# Patient Record
Sex: Male | Born: 1975 | Hispanic: Yes | Marital: Married | Smoking: Never smoker
Health system: Southern US, Community
[De-identification: ages and names within clinical notes are randomized; demographics above are authoritative.]

## PROBLEM LIST (undated history)

## (undated) DIAGNOSIS — E119 Type 2 diabetes mellitus without complications: Secondary | ICD-10-CM

---

## 2016-12-14 ENCOUNTER — Inpatient Hospital Stay (HOSPITAL_COMMUNITY)
Admission: EM | Admit: 2016-12-14 | Discharge: 2016-12-17 | DRG: 917 | Disposition: A | Discharge status: Home / Self Care | Payer: Self-pay | Attending: Internal Medicine | Admitting: Internal Medicine

## 2016-12-14 ENCOUNTER — Encounter (HOSPITAL_COMMUNITY): Payer: Self-pay | Admitting: Emergency Medicine

## 2016-12-14 DIAGNOSIS — Z87891 Personal history of nicotine dependence: Secondary | ICD-10-CM

## 2016-12-14 DIAGNOSIS — K2921 Alcoholic gastritis with bleeding: Secondary | ICD-10-CM | POA: Diagnosis present

## 2016-12-14 DIAGNOSIS — E1110 Type 2 diabetes mellitus with ketoacidosis without coma: Secondary | ICD-10-CM | POA: Diagnosis present

## 2016-12-14 DIAGNOSIS — K920 Hematemesis: Secondary | ICD-10-CM | POA: Diagnosis present

## 2016-12-14 DIAGNOSIS — T512X1A Toxic effect of 2-Propanol, accidental (unintentional), initial encounter: Principal | ICD-10-CM

## 2016-12-14 DIAGNOSIS — F10230 Alcohol dependence with withdrawal, uncomplicated: Secondary | ICD-10-CM | POA: Diagnosis present

## 2016-12-14 DIAGNOSIS — R739 Hyperglycemia, unspecified: Secondary | ICD-10-CM | POA: Diagnosis present

## 2016-12-14 DIAGNOSIS — F10939 Alcohol use, unspecified with withdrawal, unspecified: Secondary | ICD-10-CM | POA: Diagnosis present

## 2016-12-14 DIAGNOSIS — F10239 Alcohol dependence with withdrawal, unspecified: Secondary | ICD-10-CM

## 2016-12-14 DIAGNOSIS — E860 Dehydration: Secondary | ICD-10-CM | POA: Diagnosis present

## 2016-12-14 DIAGNOSIS — K760 Fatty (change of) liver, not elsewhere classified: Secondary | ICD-10-CM | POA: Diagnosis present

## 2016-12-14 DIAGNOSIS — E1310 Other specified diabetes mellitus with ketoacidosis without coma: Secondary | ICD-10-CM

## 2016-12-14 DIAGNOSIS — R945 Abnormal results of liver function studies: Secondary | ICD-10-CM | POA: Diagnosis present

## 2016-12-14 DIAGNOSIS — M6282 Rhabdomyolysis: Secondary | ICD-10-CM | POA: Diagnosis present

## 2016-12-14 DIAGNOSIS — R7989 Other specified abnormal findings of blood chemistry: Secondary | ICD-10-CM

## 2016-12-14 DIAGNOSIS — E871 Hypo-osmolality and hyponatremia: Secondary | ICD-10-CM | POA: Diagnosis present

## 2016-12-14 DIAGNOSIS — Y92009 Unspecified place in unspecified non-institutional (private) residence as the place of occurrence of the external cause: Secondary | ICD-10-CM

## 2016-12-14 HISTORY — DX: Type 2 diabetes mellitus without complications: E11.9

## 2016-12-14 LAB — COMPREHENSIVE METABOLIC PANEL
ALBUMIN: 4.7 g/dL (ref 3.5–5.0)
ALT: 80 U/L — AB (ref 17–63)
AST: 115 U/L — AB (ref 15–41)
Alkaline Phosphatase: 100 U/L (ref 38–126)
Anion gap: 20 — ABNORMAL HIGH (ref 5–15)
BILIRUBIN TOTAL: 1.8 mg/dL — AB (ref 0.3–1.2)
BUN: 18 mg/dL (ref 6–20)
CO2: 13 mmol/L — ABNORMAL LOW (ref 22–32)
Calcium: 8.9 mg/dL (ref 8.9–10.3)
Chloride: 86 mmol/L — ABNORMAL LOW (ref 101–111)
Creatinine, Ser: 1.15 mg/dL (ref 0.61–1.24)
GFR calc Af Amer: >60 mL/min (ref >60)
GFR calc non Af Amer: >60 mL/min (ref >60)
GLUCOSE: 277 mg/dL — AB (ref 65–99)
POTASSIUM: 4.3 mmol/L (ref 3.5–5.1)
Sodium: 119 mmol/L — CL (ref 135–145)
TOTAL PROTEIN: 9.1 g/dL — AB (ref 6.5–8.1)

## 2016-12-14 LAB — URINALYSIS, ROUTINE W REFLEX MICROSCOPIC
BACTERIA UA: NONE SEEN
Bilirubin Urine: NEGATIVE
Glucose, UA: >=500 mg/dL — AB
Ketones, ur: 80 mg/dL — AB
Leukocytes, UA: NEGATIVE
NITRITE: NEGATIVE
Protein, ur: 100 mg/dL — AB
SPECIFIC GRAVITY, URINE: 1.025 (ref 1.005–1.030)
pH: 5 (ref 5.0–8.0)

## 2016-12-14 LAB — ACETAMINOPHEN LEVEL
Acetaminophen (Tylenol), Serum: <10 ug/mL — ABNORMAL LOW (ref 10–30)
Acetaminophen (Tylenol), Serum: <10 ug/mL — ABNORMAL LOW (ref 10–30)

## 2016-12-14 LAB — CBC
HEMATOCRIT: 44.8 % (ref 39.0–52.0)
Hemoglobin: 16.4 g/dL (ref 13.0–17.0)
MCH: 34.4 pg — ABNORMAL HIGH (ref 26.0–34.0)
MCHC: 36.6 g/dL — AB (ref 30.0–36.0)
MCV: 93.9 fL (ref 78.0–100.0)
Platelets: 247 10*3/uL (ref 150–400)
RBC: 4.77 MIL/uL (ref 4.22–5.81)
RDW: 12.1 % (ref 11.5–15.5)
WBC: 11.8 10*3/uL — ABNORMAL HIGH (ref 4.0–10.5)

## 2016-12-14 LAB — I-STAT CG4 LACTIC ACID, ED: LACTIC ACID, VENOUS: 1.69 mmol/L (ref 0.5–1.9)

## 2016-12-14 LAB — PROTIME-INR
INR: 1.1
Prothrombin Time: 14.2 seconds (ref 11.4–15.2)

## 2016-12-14 LAB — POC OCCULT BLOOD, ED: FECAL OCCULT BLD: POSITIVE — AB

## 2016-12-14 LAB — ETHANOL

## 2016-12-14 LAB — SALICYLATE LEVEL

## 2016-12-14 LAB — LIPASE, BLOOD: Lipase: 31 U/L (ref 11–51)

## 2016-12-14 MED ORDER — FAMOTIDINE IN NACL 20-0.9 MG/50ML-% IV SOLN
20.0000 mg | Freq: Once | INTRAVENOUS | Status: AC
  Administered 2016-12-14: 20 mg via INTRAVENOUS
  Filled 2016-12-14: qty 50

## 2016-12-14 MED ORDER — LORAZEPAM 2 MG/ML IJ SOLN
1.0000 mg | Freq: Once | INTRAMUSCULAR | Status: AC
  Administered 2016-12-14: 1 mg via INTRAVENOUS
  Filled 2016-12-14: qty 1

## 2016-12-14 MED ORDER — SODIUM CHLORIDE 0.9 % IV SOLN
80.0000 mg | Freq: Once | INTRAVENOUS | Status: AC
  Administered 2016-12-14: 80 mg via INTRAVENOUS
  Filled 2016-12-14: qty 80

## 2016-12-14 MED ORDER — SODIUM CHLORIDE 0.9 % IV SOLN
INTRAVENOUS | Status: DC
  Administered 2016-12-14 – 2016-12-15 (×2): via INTRAVENOUS

## 2016-12-14 MED ORDER — PANTOPRAZOLE SODIUM 40 MG IV SOLR: 40.0000 mg | Freq: Two times a day (BID) | INTRAVENOUS | Status: DC

## 2016-12-14 MED ORDER — THIAMINE HCL 100 MG/ML IJ SOLN
100.0000 mg | Freq: Once | INTRAMUSCULAR | Status: AC
  Administered 2016-12-14: 100 mg via INTRAVENOUS
  Filled 2016-12-14: qty 2

## 2016-12-14 MED ORDER — ONDANSETRON 4 MG PO TBDP
ORAL_TABLET | ORAL | Status: AC
  Filled 2016-12-14: qty 1

## 2016-12-14 MED ORDER — ONDANSETRON 4 MG PO TBDP
4.0000 mg | ORAL_TABLET | Freq: Once | ORAL | Status: AC | PRN
  Administered 2016-12-14: 4 mg via ORAL

## 2016-12-14 MED ORDER — ONDANSETRON HCL 4 MG/2ML IJ SOLN
4.0000 mg | Freq: Once | INTRAMUSCULAR | Status: AC
  Administered 2016-12-14: 4 mg via INTRAVENOUS
  Filled 2016-12-14: qty 2

## 2016-12-14 MED ORDER — SODIUM CHLORIDE 0.9 % IV SOLN
8.0000 mg/h | INTRAVENOUS | Status: DC
  Administered 2016-12-14 – 2016-12-16 (×4): 8 mg/h via INTRAVENOUS
  Filled 2016-12-14 (×11): qty 80

## 2016-12-14 MED ORDER — PANTOPRAZOLE SODIUM 40 MG IV SOLR: 40.0000 mg | Freq: Once | INTRAVENOUS | Status: DC

## 2016-12-14 NOTE — ED Notes (Signed)
ED Provider at bedside. 

## 2016-12-14 NOTE — ED Notes (Signed)
Dr. Donnald GarrePfeiffer informed of Na 119

## 2016-12-14 NOTE — H&P (Signed)
Fox ChaseAntonio Bailon Morgan ZOX:[id number] DOB: [birth year] DOA: 12/14/2016     PCP: Patient, No Pcp Per   Outpatient Specialists: [facility]  Patient coming from:   home Lives  With family    Chief Complaint: Nausea vomiting abdominal pain   HPI: Richard Morgan is a 41 y.o. male with medical history significant of Alcohol abuse and DM    Presented with 4 day history of nausea vomiting abdominal pain and epigastric region, vomitus significant for coffee-ground emesis. Patient drinks an regular basis about 6 beers per day. Sunday family noted that he drank a whole bottle of isopropyl alcohol because he couldn't get to regular alcohol.  Patient states he did not notice make him sick. He was noted to be severely confused thereafter. Patient had not had alcohol since Monday for past 3 days. Patient is no primary care provider and unaware about any past medical history. He denies history of seizures or DTs in the past but does reports tremors.  He states 3 years ago he was told that he was diabetic but never followed up.   IN ER:  Temp (24hrs), Avg:98.5 F (36.9 C), Min:98.5 F (36.9 C), Max:98.5 F (36.9 C)      on arrival  ED Triage Vitals  Enc Vitals Group     BP 12/14/16 1816 (!) 135/93     Pulse Rate 12/14/16 1816 (!) 136     Resp 12/14/16 1816 16     Temp 12/14/16 1816 98.5 F (36.9 C)     Temp Source 12/14/16 1816 Oral     SpO2 12/14/16 1816 98 %     Weight 12/14/16 1817 160 lb (72.6 kg)     Height 12/14/16 1817 5\' 1"  (1.549 m)     Head Circumference --      Peak Flow --      Pain Score 12/14/16 1821 10     Pain Loc --      Pain Edu? --      Excl. in GC? --    RR 19 HR 115 BP 138/105 LA 1.69 Lipase 31 Na 119 K 4.3 Bicarb 13 protein 9.1 alb 4.7 AG 20 AST 115 ALT 80  WBC 11.8 Hg 16.4 PLT 247   Following Medications were ordered in ER: Medications  0.9 %  sodium chloride infusion ( Intravenous New Bag/Given 12/14/16 2006)  pantoprazole (PROTONIX) 80 mg in sodium  chloride 0.9 % 100 mL IVPB (not administered)  ondansetron (ZOFRAN-ODT) disintegrating tablet 4 mg (4 mg Oral Given 12/14/16 1824)  ondansetron (ZOFRAN) injection 4 mg (4 mg Intravenous Given 12/14/16 2009)  famotidine (PEPCID) IVPB 20 mg premix (0 mg Intravenous Stopped 12/14/16 2145)  LORazepam (ATIVAN) injection 1 mg (1 mg Intravenous Given 12/14/16 2112)     ER provider discussed case with: Poison control center who recommends supportive management recommends obtaining a Ethelene glycol and methanol levels in addition to salicylate, acetaminophen, baseline EKG.. And GI who will see in consult in AM Hospitalist was called for admission for alcohol withdrawal, isopropyl alcohol ingestion, coffee-ground emesis  Review of Systems:    Pertinent positives include: confusion, abdominal pain, nausea, vomiting hematemesis  Constitutional:  No weight loss, night sweats, Fevers, chills, fatigue, weight loss  HEENT:  No headaches, Difficulty swallowing,Tooth/dental problems,Sore throat,  No sneezing, itching, ear ache, nasal congestion, post nasal drip,  Cardio-vascular:  No chest pain, Orthopnea, PND, anasarca, dizziness, palpitations.no Bilateral lower extremity swelling  GI:  No heartburn, indigestion,  diarrhea, change in bowel  habits, loss of appetite, melena, blood in stool, Resp:  no shortness of breath at rest. No dyspnea on exertion, No excess mucus, no productive cough, No non-productive cough, No coughing up of blood.No change in color of mucus.No wheezing. Skin:  no rash or lesions. No jaundice GU:  no dysuria, change in color of urine, no urgency or frequency. No straining to urinate.  No flank pain.  Musculoskeletal:  No joint pain or no joint swelling. No decreased range of motion. No back pain.  Psych:  No change in mood or affect. No depression or anxiety. No memory loss.  Neuro: no localizing neurological complaints, no tingling, no weakness, no double vision, no gait  abnormality, no slurred speech,    As per HPI otherwise 10 point review of systems negative.   Past Medical History: History reviewed. No pertinent past medical history. History reviewed. No pertinent surgical history.   Social History:  Ambulatory  Independently     reports that he has quit smoking. He has never used smokeless tobacco. He reports that he drinks alcohol. He reports that he does not use drugs.  Allergies:  No Known Allergies     Family History:   Family History  Problem Relation Age of Onset  . Diabetes Neg Hx   . Hypertension Neg Hx   . Cancer Neg Hx     Medications: Prior to Admission medications   Medication Sig Start Date End Date Taking? Authorizing Provider  calcium carbonate (TUMS - DOSED IN MG ELEMENTAL CALCIUM) 500 MG chewable tablet Chew 1-2 tablets by mouth 3 (three) times daily as needed for indigestion or heartburn.   Yes [provider]    Physical Exam: Patient Vitals for the past 24 hrs:  BP Temp Temp src Pulse Resp SpO2 Height Weight  12/14/16 2200 (!) 138/105 - - (!) 115 19 100 % - -  12/14/16 2130 (!) 138/102 - - (!) 115 (!) 35 100 % - -  12/14/16 2100 (!) 144/102 - - (!) 117 19 99 % - -  12/14/16 2030 (!) 148/109 - - (!) 108 (!) 21 100 % - -  12/14/16 2015 (!) 157/111 - - (!) 118 20 100 % - -  12/14/16 2011 (!) 146/108 - - (!) 108 - - - -  12/14/16 2000 (!) 146/108 - - (!) 111 19 100 % - -  12/14/16 1951 (!) 166/111 - - (!) 115 15 100 % - -  12/14/16 1817 - - - - - - 5\' 1"  (1.549 m) 72.6 kg (160 lb)  12/14/16 1816 (!) 135/93 98.5 F (36.9 C) Oral (!) 136 16 98 % - -    1. General:  in No Acute distress 2. Psychological: Alert and  Oriented 3. Head/ENT:   Dry Mucous Membranes                          Head Non traumatic, neck supple                          Poor Dentition 4. SKIN:   decreased Skin turgor,  Skin clean Dry and intact no rash 5. Heart: Regular rate and rhythm no  Murmur, Rub or gallop 6. Lungs: Clear  to auscultation bilaterally, no wheezes or crackles   7. Abdomen: Soft, epigastric tenderness, Non distended 8. Lower extremities: no clubbing, cyanosis, or edema 9. Neurologically Grossly intact, moving all 4 extremities equally  10. MSK: Normal range of motion   body mass index is 30.23 kg/m.  Labs on Admission:   Labs on Admission: I have personally reviewed following labs and imaging studies  CBC:  Recent Labs Lab 12/14/16 1830  WBC 11.8*  HGB 16.4  HCT 44.8  MCV 93.9  PLT 247   Basic Metabolic Panel:  Recent Labs Lab 12/14/16 1830  NA 119*  K 4.3  CL 86*  CO2 13*  GLUCOSE 277*  BUN 18  CREATININE 1.15  CALCIUM 8.9   GFR: Estimated Creatinine Clearance: 74.4 mL/min (by C-G formula based on SCr of 1.15 mg/dL). Liver Function Tests:  Recent Labs Lab 12/14/16 1830  AST 115*  ALT 80*  ALKPHOS 100  BILITOT 1.8*  PROT 9.1*  ALBUMIN 4.7    Recent Labs Lab 12/14/16 1830  LIPASE 31   No results for input(s): AMMONIA in the last 168 hours. Coagulation Profile: No results for input(s): INR, PROTIME in the last 168 hours. Cardiac Enzymes: No results for input(s): CKTOTAL, CKMB, CKMBINDEX, TROPONINI in the last 168 hours. BNP (last 3 results) No results for input(s): PROBNP in the last 8760 hours. HbA1C: No results for input(s): HGBA1C in the last 72 hours. CBG: No results for input(s): GLUCAP in the last 168 hours. Lipid Profile: No results for input(s): CHOL, HDL, LDLCALC, TRIG, CHOLHDL, LDLDIRECT in the last 72 hours. Thyroid Function Tests: No results for input(s): TSH, T4TOTAL, FREET4, T3FREE, THYROIDAB in the last 72 hours. Anemia Panel: No results for input(s): VITAMINB12, FOLATE, FERRITIN, TIBC, IRON, RETICCTPCT in the last 72 hours. Urine analysis:    Component Value Date/Time   COLORURINE YELLOW 12/14/2016 2107   APPEARANCEUR CLEAR 12/14/2016 2107   LABSPEC 1.025 12/14/2016 2107   PHURINE 5.0 12/14/2016 2107   GLUCOSEU >=500 (A)  12/14/2016 2107   HGBUR SMALL (A) 12/14/2016 2107   BILIRUBINUR NEGATIVE 12/14/2016 2107   KETONESUR 80 (A) 12/14/2016 2107   PROTEINUR 100 (A) 12/14/2016 2107   NITRITE NEGATIVE 12/14/2016 2107   LEUKOCYTESUR NEGATIVE 12/14/2016 2107   Sepsis Labs: @LABRCNTIP (procalcitonin:4,lacticidven:4) )No results found for this or any previous visit (from the past 240 hour(s)).     UA    no evidence of UTI     No results found for: HGBA1C  Estimated Creatinine Clearance: 74.4 mL/min (by C-G formula based on SCr of 1.15 mg/dL).  BNP (last 3 results) No results for input(s): PROBNP in the last 8760 hours.   ECG REPORT  Independently reviewed Rate: 121  Rhythm: sinus tachycardia ST&T Change: No acute ischemic changes   QTC 415  Filed Weights   12/14/16 1817  Weight: 72.6 kg (160 lb)     Cultures: No results found for: SDES, SPECREQUEST, CULT, REPTSTATUS   Radiological Exams on Admission: No results found.  Chart has been reviewed    Assessment/Plan  41 y.o. male with medical history significant of Alcohol abuse  Admitted for  alcohol withdrawal, isopropyl alcohol ingestion, coffee-ground emesis In Metabolic acidosis with AG    Present on Admission: . Hyponatremia - - likely secondary to dehydration vs beer potomania, will give IVF, check Urine Na, Cr, Osmolarity. Monitor Na levels to avoid over aggressive correction. Check TSH.   If no improvement with IVF will initiate further work up for SIADH if appropriate. Serial BMET  . Isopropyl alcohol poisoning - Supportive management,  . Hyperglycemia - patient  States was told he is diabetic in the past. Check HgA1C, TSH,  DKA is on diferential .  Elevated LFTs - in the setting of alcohol abuse, will follow LFT, order hepatitis panel . Coffee ground emesis - Protonix drip, obtain type and screen follow serial CBC, Suspect Mallory-Weiss tear versus alcoholic gastritis appreciate GI consult . Alcohol withdrawal syndrome with  complication, with unspecified complication (HCC) - CIWA protocol . Dehydration - rehydrate Ketosis with Metabolic acidosis - Differential include Isopropyl induced Ketosis,  Alcoholic ketoacidosis, starvation ketoacidosis secondary to decreased by mouth intake and repeated nausea vomiting and DKA with history of diabetes untreated. Given possibility of DKA with anion gap - initiate glucose stabilizer and rehydrate aggressively.  Other plan as per orders.  DVT prophylaxis:  SCD   Code Status:  FULL CODE  as per patient    Family Communication:   Family not at  Bedside    Disposition Plan:    To home once workup is complete and patient is stable                           Social Work and diabetic coordinator  consulted                          Consults called: GI     Admission status:    inpatient       Level of care     SDU      I have spent a total of 56 min on this admission   Melaine Mcphee 12/15/2016, 12:00 AM    [facility]  Pager [phone]   after 2 AM please page floor coverage PA If 7AM-7PM, please contact the day team taking care of the patient  [email or web address]  Password [email or web address]

## 2016-12-14 NOTE — ED Triage Notes (Signed)
Pt is alcoholic and wants to quit. He has tried to stop on his own but has been vomiting,can't eat, having abd pain, insomnia. Pt states he drank an entire bottle of rubbing alcohol and family states they saw him do this on Monday. No other alcohol since Monday. Pt usually drinks 6 beers per day usually.

## 2016-12-14 NOTE — ED Provider Notes (Signed)
[facility]-EMERGENCY DEPT Provider Note   CSN: [id number] Arrival date & time: 12/14/16  1752     History   Chief Complaint Chief Complaint  Patient presents with  . Abdominal Pain  . Alcohol Problem    HPI   Blood pressure (!) 157/111, pulse (!) 118, temperature 98.5 F (36.9 C), temperature source Oral, resp. rate 20, height 5\' 1"  (1.549 m), weight 72.6 kg (160 lb), SpO2 100 %.  Richard Morgan is a 41 y.o. male complaining of Multiple episodes of coffee-ground emesis over the last 3 days. Patient states that he drinks alcohol regularly he drinks beer daily. He does get shaky when he doesn't have alcohol abuse never had any seizures or hallucinations. On Sunday (4 days ago) he did not have any beer, his wife had the car and he drank a bottle of rubbing alcohol mixed with orange juice. He denies a suicide attempt. He states he did not realize that rubbing alcohol was the dangerous to him. That day his family states that he was acting very bizarrely, opening multiple doors, eyes were too wide and glazed, he urinated on the floor. The next day he started vomiting multiple times. He states that he does not have any known liver disease.Patient with no prior medical evaluation no primary care.  Family member has text added at picture of the bottle is 70% isopropyl alcohol is a 1 L bottle and patient said only half of the bottle was available he drank approximately 8 ounces.   History reviewed. No pertinent past medical history.  Patient Active Problem List   Diagnosis Date Noted  . Hyponatremia 12/14/2016  . 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 Hyperglycemia 12/14/2016  . Elevated LFTs 12/14/2016  . Coffee ground emesis 12/14/2016    History reviewed. No pertinent surgical history.     Home Medications    Prior to Admission medications   Medication Sig Start Date End Date Taking? Authorizing Provider  calcium carbonate (TUMS - DOSED IN MG ELEMENTAL CALCIUM) 500 MG  chewable tablet Chew 1-2 tablets by mouth 3 (three) times daily as needed for indigestion or heartburn.   Yes [provider]    Family History History reviewed. No pertinent family history.  Social History Social History  Substance Use Topics  . Smoking status: Former Games developer  . Smokeless tobacco: Never Used  . Alcohol use Yes     Allergies   Patient has no known allergies.   Review of Systems Review of Systems   A complete review of systems was obtained and all systems are negative except as noted in the HPI and PMH.    Physical Exam Updated Vital Signs BP (!) 138/105   Pulse (!) 115   Temp 98.5 F (36.9 C) (Oral)   Resp 19   Ht 5\' 1"  (1.549 m)   Wt 72.6 kg (160 lb)   SpO2 100%   BMI 30.23 kg/m   Physical Exam  Constitutional: He is oriented to person, place, and time. He appears well-developed and well-nourished. No distress.  HENT:  Head: Normocephalic and atraumatic.  Mouth/Throat: Oropharynx is clear and moist.  Eyes: Pupils are equal, round, and reactive to light. Conjunctivae and EOM are normal.  Neck: Normal range of motion.  Cardiovascular: Regular rhythm and intact distal pulses.   Tachycardic, regular  Pulmonary/Chest: Effort normal and breath sounds normal.  Abdominal: Soft. There is tenderness.  Tender in the bilateral upper quadrants with no guarding or rebound.  Genitourinary:  Genitourinary Comments: Rectal  exam is chaperoned by technician: Normal rectal tone, normal stool color.  Musculoskeletal: Normal range of motion.  Neurological: He is alert and oriented to person, place, and time.  Skin: He is not diaphoretic.  Psychiatric: He has a normal mood and affect.  Nursing note and vitals reviewed.    ED Treatments / Results  Labs (all labs ordered are listed, but only abnormal results are displayed) Labs Reviewed  COMPREHENSIVE METABOLIC PANEL - Abnormal; Notable for the following:       Result Value   Sodium 119 (*)     Chloride 86 (*)    CO2 13 (*)    Glucose, Bld 277 (*)    Total Protein 9.1 (*)    AST 115 (*)    ALT 80 (*)    Total Bilirubin 1.8 (*)    Anion gap 20 (*)    All other components within normal limits  CBC - Abnormal; Notable for the following:    WBC 11.8 (*)    MCH 34.4 (*)    MCHC 36.6 (*)    All other components within normal limits  URINALYSIS, ROUTINE W REFLEX MICROSCOPIC - Abnormal; Notable for the following:    Glucose, UA >=500 (*)    Hgb urine dipstick SMALL (*)    Ketones, ur 80 (*)    Protein, ur 100 (*)    Squamous Epithelial / LPF 0-5 (*)    All other components within normal limits  ACETAMINOPHEN LEVEL - Abnormal; Notable for the following:    Acetaminophen (Tylenol), Serum <10 (*)    All other components within normal limits  ACETAMINOPHEN LEVEL - Abnormal; Notable for the following:    Acetaminophen (Tylenol), Serum <10 (*)    All other components within normal limits  LIPASE, BLOOD  ETHANOL  SALICYLATE LEVEL  SALICYLATE LEVEL  VOLATILES,BLD-ACETONE,ETHANOL,ISOPROP,METHANOL  OSMOLALITY  PROTIME-INR  BASIC METABOLIC PANEL  HIV ANTIBODY (ROUTINE TESTING)  TSH  CK  TROPONIN I  TROPONIN I  TROPONIN I  CREATININE, URINE, RANDOM  OSMOLALITY, URINE  SODIUM, URINE, RANDOM  RAPID URINE DRUG SCREEN, HOSP PERFORMED  I-STAT CG4 LACTIC ACID, ED  POC OCCULT BLOOD, ED    EKG  EKG Interpretation None       Radiology No results found.  Procedures Procedures (including critical care time)  CRITICAL CARE Performed by: Wynetta EmeryPISCIOTTA, Lita Flynn   Total critical care time: 35 minutes  Critical care time was exclusive of separately billable procedures and treating other patients.  Critical care was necessary to treat or prevent imminent or life-threatening deterioration.  Critical care was time spent personally by me on the following activities: development of treatment plan with patient and/or surrogate as well as nursing, discussions with consultants,  evaluation of patient's response to treatment, examination of patient, obtaining history from patient or surrogate, ordering and performing treatments and interventions, ordering and review of laboratory studies, ordering and review of radiographic studies, pulse oximetry and re-evaluation of patient's condition.   Medications Ordered in ED Medications  0.9 %  sodium chloride infusion ( Intravenous New Bag/Given 12/14/16 2006)  pantoprazole (PROTONIX) 80 mg in sodium chloride 0.9 % 100 mL IVPB (not administered)  pantoprazole (PROTONIX) 80 mg in sodium chloride 0.9 % 250 mL (0.32 mg/mL) infusion (not administered)  pantoprazole (PROTONIX) injection 40 mg (not administered)  ondansetron (ZOFRAN-ODT) disintegrating tablet 4 mg (4 mg Oral Given 12/14/16 1824)  ondansetron (ZOFRAN) injection 4 mg (4 mg Intravenous Given 12/14/16 2009)  famotidine (PEPCID) IVPB 20 mg premix (0  mg Intravenous Stopped 12/14/16 2145)  LORazepam (ATIVAN) injection 1 mg (1 mg Intravenous Given 12/14/16 2112)     Initial Impression / Assessment and Plan / ED Course  I have reviewed the triage vital signs and the nursing notes.  Pertinent labs & imaging results that were available during my care of the patient were reviewed by me and considered in my medical decision making (see chart for details).     Vitals:   12/14/16 2030 12/14/16 2100 12/14/16 2130 12/14/16 2200  BP: (!) 148/109 (!) 144/102 (!) 138/102 (!) 138/105  Pulse: (!) 108 (!) 117 (!) 115 (!) 115  Resp: (!) 21 19 (!) 35 19  Temp:      TempSrc:      SpO2: 100% 99% 100% 100%  Weight:      Height:        Medications  0.9 %  sodium chloride infusion ( Intravenous New Bag/Given 12/14/16 2006)  pantoprazole (PROTONIX) 80 mg in sodium chloride 0.9 % 100 mL IVPB (not administered)  pantoprazole (PROTONIX) 80 mg in sodium chloride 0.9 % 250 mL (0.32 mg/mL) infusion (not administered)  pantoprazole (PROTONIX) injection 40 mg (not administered)  ondansetron  (ZOFRAN-ODT) disintegrating tablet 4 mg (4 mg Oral Given 12/14/16 1824)  ondansetron (ZOFRAN) injection 4 mg (4 mg Intravenous Given 12/14/16 2009)  famotidine (PEPCID) IVPB 20 mg premix (0 mg Intravenous Stopped 12/14/16 2145)  LORazepam (ATIVAN) injection 1 mg (1 mg Intravenous Given 12/14/16 2112)    Owens & Minorntonio Bailon Morgan is 41 y.o. male presenting withAccidental toxicity from isopropyl alcohol poisoning. He states that he drank a bottle 4 days ago because he wanted to drink alcohol but he couldn't get to the store to buy beer. Initially altered and acting bizarrely, he is oriented 3 with a normal neurologic exam, non-somnolent. He has had multiple episodes of coffee-ground emesis over the last 3 days. Patient is tachycardic on my exam, abdominal exam is nonsurgical.  [facility]: They have consulted the toxicologist, she recommends obtaining a sling glycol and methanol levels in addition to salicylate, acetaminophen, baseline EKG. Will need admission for correction of hyponatremia. EKG with a normal QTC, sinus tachycardia.  Poison control has recommended sending the toxic alcohol levels to [facility] [place] or [facility] [place] because they But I results however, I have discussed this with Byrd HesselbachMaria in lab he states that they can be sent to lab core and they can be sent as a stat and we will have the results turned around within 24 hours.   Patient is reporting coffee-ground emesis, started on protonix and pepcid.  Hyponatremia of 119, normal saline given at 250 mL per hour.  Unassigned admission to [facility].  Discussed with GI, they will evaluate in a.m.   Final Clinical Impressions(s) / ED Diagnoses   Final diagnoses:  Hyponatremia  Accidental poisoning by isopropyl alcohol, initial encounter    New Prescriptions New Prescriptions   No medications on file     Kaylyn Limisciotta, Kailin Principato, PA-C 12/14/16 2301    Arby BarrettePfeiffer, Marcy, MD 12/15/16  0003

## 2016-12-15 ENCOUNTER — Inpatient Hospital Stay (HOSPITAL_COMMUNITY): Payer: Self-pay

## 2016-12-15 LAB — BASIC METABOLIC PANEL
Anion gap: 18 — ABNORMAL HIGH (ref 5–15)
BUN: 17 mg/dL (ref 6–20)
CHLORIDE: 93 mmol/L — AB (ref 101–111)
CO2: 13 mmol/L — ABNORMAL LOW (ref 22–32)
Calcium: 8.6 mg/dL — ABNORMAL LOW (ref 8.9–10.3)
Creatinine, Ser: 1.13 mg/dL (ref 0.61–1.24)
GFR calc non Af Amer: >60 mL/min (ref >60)
Glucose, Bld: 230 mg/dL — ABNORMAL HIGH (ref 65–99)
POTASSIUM: 4.2 mmol/L (ref 3.5–5.1)
SODIUM: 124 mmol/L — AB (ref 135–145)

## 2016-12-15 LAB — GLUCOSE, CAPILLARY
GLUCOSE-CAPILLARY: 160 mg/dL — AB (ref 65–99)
GLUCOSE-CAPILLARY: 185 mg/dL — AB (ref 65–99)
GLUCOSE-CAPILLARY: 166 mg/dL — AB (ref 65–99)
GLUCOSE-CAPILLARY: 212 mg/dL — AB (ref 65–99)
GLUCOSE-CAPILLARY: 154 mg/dL — AB (ref 65–99)
GLUCOSE-CAPILLARY: 190 mg/dL — AB (ref 65–99)
GLUCOSE-CAPILLARY: 170 mg/dL — AB (ref 65–99)
GLUCOSE-CAPILLARY: 117 mg/dL — AB (ref 65–99)
GLUCOSE-CAPILLARY: 179 mg/dL — AB (ref 65–99)
Glucose-Capillary: 158 mg/dL — ABNORMAL HIGH (ref 65–99)
Glucose-Capillary: 163 mg/dL — ABNORMAL HIGH (ref 65–99)
Glucose-Capillary: 139 mg/dL — ABNORMAL HIGH (ref 65–99)
Glucose-Capillary: 114 mg/dL — ABNORMAL HIGH (ref 65–99)
Glucose-Capillary: 126 mg/dL — ABNORMAL HIGH (ref 65–99)
Glucose-Capillary: 151 mg/dL — ABNORMAL HIGH (ref 65–99)
Glucose-Capillary: 152 mg/dL — ABNORMAL HIGH (ref 65–99)
Glucose-Capillary: 159 mg/dL — ABNORMAL HIGH (ref 65–99)

## 2016-12-15 LAB — BLOOD GAS, ARTERIAL
Acid-base deficit: 8.6 mmol/L — ABNORMAL HIGH (ref 0.0–2.0)
BICARBONATE: 15.7 mmol/L — AB (ref 20.0–28.0)
Drawn by: 27022
FIO2: 21
O2 Saturation: 98.7 %
PCO2 ART: 27.7 mmHg — AB (ref 32.0–48.0)
PH ART: 7.37 (ref 7.350–7.450)
PO2 ART: 117 mmHg — AB (ref 83.0–108.0)
Patient temperature: 97.8

## 2016-12-15 LAB — RAPID URINE DRUG SCREEN, HOSP PERFORMED
Amphetamines: NOT DETECTED
BARBITURATES: NOT DETECTED
BENZODIAZEPINES: NOT DETECTED
COCAINE: NOT DETECTED
OPIATES: NOT DETECTED
TETRAHYDROCANNABINOL: NOT DETECTED

## 2016-12-15 LAB — CBC
HCT: 40.8 % (ref 39.0–52.0)
Hemoglobin: 14.5 g/dL (ref 13.0–17.0)
MCH: 33.4 pg (ref 26.0–34.0)
MCHC: 35.5 g/dL (ref 30.0–36.0)
MCV: 94 fL (ref 78.0–100.0)
PLATELETS: 197 10*3/uL (ref 150–400)
RBC: 4.34 MIL/uL (ref 4.22–5.81)
RDW: 12.2 % (ref 11.5–15.5)
WBC: 11.6 10*3/uL — ABNORMAL HIGH (ref 4.0–10.5)

## 2016-12-15 LAB — TROPONIN I
TROPONIN I: 0.03 ng/mL — AB (ref <0.03)
Troponin I: <0.03 ng/mL (ref <0.03)

## 2016-12-15 LAB — COMPREHENSIVE METABOLIC PANEL
ALK PHOS: 76 U/L (ref 38–126)
ALT: 59 U/L (ref 17–63)
AST: 77 U/L — ABNORMAL HIGH (ref 15–41)
Albumin: 3.8 g/dL (ref 3.5–5.0)
Anion gap: 15 (ref 5–15)
BUN: 15 mg/dL (ref 6–20)
CALCIUM: 8.1 mg/dL — AB (ref 8.9–10.3)
CHLORIDE: 99 mmol/L — AB (ref 101–111)
CO2: 13 mmol/L — ABNORMAL LOW (ref 22–32)
CREATININE: 1.16 mg/dL (ref 0.61–1.24)
Glucose, Bld: 191 mg/dL — ABNORMAL HIGH (ref 65–99)
Potassium: 3.8 mmol/L (ref 3.5–5.1)
Sodium: 127 mmol/L — ABNORMAL LOW (ref 135–145)
Total Bilirubin: 1.5 mg/dL — ABNORMAL HIGH (ref 0.3–1.2)
Total Protein: 7.4 g/dL (ref 6.5–8.1)

## 2016-12-15 LAB — VOLATILES,BLD-ACETONE,ETHANOL,ISOPROP,METHANOL
Acetone, blood: 0.056 % — ABNORMAL HIGH (ref 0.000–0.010)
Ethanol, blood: NEGATIVE % (ref 0.000–0.010)
ISOPROPANOL, BLOOD: NEGATIVE % (ref 0.000–0.010)
METHANOL, BLOOD: NEGATIVE % (ref 0.000–0.010)

## 2016-12-15 LAB — TYPE AND SCREEN
ABO/RH(D): O POS
ANTIBODY SCREEN: NEGATIVE

## 2016-12-15 LAB — MRSA PCR SCREENING: MRSA BY PCR: NEGATIVE

## 2016-12-15 LAB — ABO/RH: ABO/RH(D): O POS

## 2016-12-15 LAB — MAGNESIUM: MAGNESIUM: 2.2 mg/dL (ref 1.7–2.4)

## 2016-12-15 LAB — BETA-HYDROXYBUTYRIC ACID: Beta-Hydroxybutyric Acid: 7.26 mmol/L — ABNORMAL HIGH (ref 0.05–0.27)

## 2016-12-15 LAB — TSH
TSH: 1.089 u[IU]/mL (ref 0.350–4.500)
TSH: 1.01 u[IU]/mL (ref 0.350–4.500)

## 2016-12-15 LAB — HIV ANTIBODY (ROUTINE TESTING W REFLEX): HIV SCREEN 4TH GENERATION: NONREACTIVE

## 2016-12-15 LAB — CK: Total CK: 822 U/L — ABNORMAL HIGH (ref 49–397)

## 2016-12-15 LAB — CBG MONITORING, ED
Glucose-Capillary: 224 mg/dL — ABNORMAL HIGH (ref 65–99)
Glucose-Capillary: 206 mg/dL — ABNORMAL HIGH (ref 65–99)

## 2016-12-15 LAB — OSMOLALITY: Osmolality: 294 mOsm/kg (ref 275–295)

## 2016-12-15 LAB — PHOSPHORUS: Phosphorus: <1 mg/dL — CL (ref 2.5–4.6)

## 2016-12-15 LAB — SODIUM, URINE, RANDOM: Sodium, Ur: 53 mmol/L

## 2016-12-15 MED ORDER — SODIUM CHLORIDE 0.9 % IV SOLN
INTRAVENOUS | Status: DC
  Administered 2016-12-15: 1.6 [IU]/h via INTRAVENOUS
  Filled 2016-12-15 (×2): qty 1

## 2016-12-15 MED ORDER — THIAMINE HCL 100 MG/ML IJ SOLN
100.0000 mg | Freq: Every day | INTRAMUSCULAR | Status: DC
  Administered 2016-12-15 – 2016-12-17 (×3): 100 mg via INTRAVENOUS
  Filled 2016-12-15 (×3): qty 2

## 2016-12-15 MED ORDER — K PHOS MONO-SOD PHOS DI & MONO 155-852-130 MG PO TABS
500.0000 mg | ORAL_TABLET | Freq: Two times a day (BID) | ORAL | Status: DC
  Administered 2016-12-15 (×2): 500 mg via ORAL
  Filled 2016-12-15 (×3): qty 2

## 2016-12-15 MED ORDER — POTASSIUM PHOSPHATES 15 MMOLE/5ML IV SOLN
20.0000 mmol | Freq: Once | INTRAVENOUS | Status: DC
  Administered 2016-12-15: 20 mmol via INTRAVENOUS
  Filled 2016-12-15: qty 6.67

## 2016-12-15 MED ORDER — SODIUM CHLORIDE 0.9 % IV SOLN
INTRAVENOUS | Status: DC
  Administered 2016-12-15: 02:00:00 via INTRAVENOUS

## 2016-12-15 MED ORDER — ONDANSETRON HCL 4 MG/2ML IJ SOLN: 4.0000 mg | Freq: Four times a day (QID) | INTRAMUSCULAR | Status: DC | PRN

## 2016-12-15 MED ORDER — DEXTROSE-NACL 5-0.45 % IV SOLN
INTRAVENOUS | Status: DC
  Administered 2016-12-15 – 2016-12-16 (×5): via INTRAVENOUS

## 2016-12-15 MED ORDER — PNEUMOCOCCAL VAC POLYVALENT 25 MCG/0.5ML IJ INJ
0.5000 mL | INJECTION | INTRAMUSCULAR | Status: AC | PRN
  Administered 2016-12-17: 0.5 mL via INTRAMUSCULAR

## 2016-12-15 MED ORDER — FOLIC ACID 5 MG/ML IJ SOLN
1.0000 mg | Freq: Every day | INTRAMUSCULAR | Status: DC
  Administered 2016-12-15 – 2016-12-17 (×3): 1 mg via INTRAVENOUS
  Filled 2016-12-15 (×3): qty 0.2

## 2016-12-15 MED ORDER — INSULIN REGULAR BOLUS VIA INFUSION
0.0000 [IU] | Freq: Three times a day (TID) | INTRAVENOUS | Status: DC
  Filled 2016-12-15: qty 10

## 2016-12-15 MED ORDER — LORAZEPAM 2 MG/ML IJ SOLN: 2.0000 mg | INTRAMUSCULAR | Status: DC | PRN

## 2016-12-15 MED ORDER — LIVING WELL WITH DIABETES BOOK
Freq: Once | Status: AC
  Administered 2016-12-15: 1
  Filled 2016-12-15: qty 1

## 2016-12-15 MED ORDER — SODIUM CHLORIDE 0.9 % IV SOLN
INTRAVENOUS | Status: DC
  Administered 2016-12-15 – 2016-12-16 (×2): via INTRAVENOUS

## 2016-12-15 MED ORDER — ONDANSETRON HCL 4 MG PO TABS: 4.0000 mg | ORAL_TABLET | Freq: Four times a day (QID) | ORAL | Status: DC | PRN

## 2016-12-15 MED ORDER — DEXTROSE 50 % IV SOLN: 25.0000 mL | INTRAVENOUS | Status: DC | PRN

## 2016-12-15 MED ORDER — LIVING WELL WITH DIABETES BOOK - IN SPANISH
Freq: Once | Status: AC
  Administered 2016-12-15: 13:00:00
  Filled 2016-12-15: qty 1

## 2016-12-15 NOTE — Progress Notes (Addendum)
CRITICAL VALUE ALERT  Critical Value:  Phosphorus <1.0  Date & Time Notied:  12/15/16 0608  Provider Notified: On call MD Selena BattenKim  Orders Received/Actions taken: Phosphorus replacement ordered. Will continue to monitor.

## 2016-12-15 NOTE — ED Notes (Signed)
Attempted to call report

## 2016-12-15 NOTE — Progress Notes (Signed)
Initial Nutrition Assessment  DOCUMENTATION CODES:   Not applicable  INTERVENTION:   -RD will follow for diet advancement and supplement diet as appropriate -Will follow-up with DM diet education once pt is more stable  NUTRITION DIAGNOSIS:   Inadequate oral intake related to altered GI function as evidenced by NPO status.  GOAL:   Patient will meet greater than or equal to 90% of their needs  MONITOR:   PO intake, Diet advancement, Labs, Weight trends, Skin, I & O's  REASON FOR ASSESSMENT:   Malnutrition Screening Tool, Consult Assessment of nutrition requirement/status (Diet education)  ASSESSMENT:   41 y.o. male with medical history significant of Alcohol abuse  Admitted for  alcohol withdrawal, isopropyl alcohol ingestion, coffee-ground emesis In Metabolic acidosis with AG  Spoke with pt, who reports poor appetite over the past 4-5 days, due to inability to keep down foods and liquids. Pt shares that he generally has a good appetite, consuming 3 meals per day (meals consist of chicken soup or meat, vegetables, and rice).   Pt endorses a 100# wt loss over the past year, however, no wt hx available to confirm this. Pt suspects his weight loss is related to his alcohol intake- he shares that he was consuming a 12 pack of beer daily up until a week ago.   Nutrition-Focused physical exam completed. Findings are no fat depletion, mild (calf and patella only, which pt shares is typical presentation) muscle depletion, and no edema. 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   Broached topic of blood sugar control with pt. He reports that he was told he had high blood sugars approximately 3 years ago and was prescribed medication (pt unsure which) that MD instructed him "to take for the rest of my life". However, pt only consumed one month dose and never refilled or followed-up with MD. He does not have a PCP. When asked about current blood sugar issues, pt  reported he was unaware of any issue currently. Due to pt's perceived lack of awareness of diagnosis, RD held off on further education. Pt is currently on a glucostablizer. No recent Hgb A1c to assess.   Medications reviewed and include thiamine.  Labs reviewed: Na: 127, Phos: <1 (on IV supplementation), CBGS: 163-206.   Diet Order:  Diet NPO time specified Except for: Sips with Meds  Skin:  Reviewed, no issues  Last BM:  12/15/16  Height:   Ht Readings from Last 1 Encounters:  12/15/16 5\' 5"  (1.651 m)    Weight:   Wt Readings from Last 1 Encounters:  12/15/16 154 lb 12.2 oz (70.2 kg)    Ideal Body Weight:  61.8 kg  BMI:  Body mass index is 25.75 kg/m.  Estimated Nutritional Needs:   Kcal:  1700-1900  Protein:  105-120 grams  Fluid:  1.7- 1.9 L  EDUCATION NEEDS:   Education needs no appropriate at this time  Cydnie Deason A. Mayford KnifeWilliams, RD, LDN, CDE Pager: [phone] After hours Pager: [phone]

## 2016-12-15 NOTE — ED Notes (Signed)
Informed Doutova, MD of resulted lab work.

## 2016-12-15 NOTE — Progress Notes (Signed)
Updated Denise with Poison Control regarding pt condition.

## 2016-12-15 NOTE — ED Notes (Signed)
Patient transported to Ultrasound 

## 2016-12-15 NOTE — ED Notes (Signed)
Updated Onalee Huaavid with [facility] regarding the pt's condition. They will check back again later in the morning for additional follow up.

## 2016-12-15 NOTE — Progress Notes (Signed)
[facility] PROGRESS NOTE  Richard Morgan ZOX:[id number] DOB: [birth year] DOA: 12/14/2016   PCP: Patient, No Pcp Per     Assessment/Plan: Active Problems:   Hyponatremia   Isopropyl alcohol poisoning   Hyperglycemia   Elevated LFTs   Coffee ground emesis   Alcohol withdrawal syndrome with complication, with unspecified complication (HCC)   Dehydration   DKA (diabetic ketoacidoses) (HCC)   41 y.o. male history of alcohol abuse and diabetes  complaining of Multiple episodes of coffee-ground emesis over the last 3 days. Patient states that he drinks alcohol regularly he drinks beer daily. He does get shaky when he doesn't have alcohol abuse never had any seizures or hallucinations. On Sunday (4 days ago) he did not have any beer, his wife had the car and he drank a bottle of rubbing alcohol mixed with orange juice. He denies a suicide attempt. Subsequently found to have severe hyponatremia, metabolic acidosis, hyperphosphatemia. Patient also found to have elevated CBG, increased anion gap, admitted for concomitant DKA  Assessment and plan  Hyponatremia - improving 119>127- likely secondary to dehydration vs beer potomania, improved after IV hydration with normal saline. Monitor Na levels to avoid over aggressive correction. Normal TSH.   Serial BMET. Reduce rate of normal saline to 125 mL an hour, to slow down the correction  . Isopropyl alcohol poisoning - Supportive management, isopropyl alcohol poisoning does not cause anion gap metabolic acidosis, causes ketosis, ketonuria noted, QTC prolongation, will repeat EKG, poison control notified and following  Hypophosphatemia-replete aggressively  . Concomitant DKA - initial anion gap was 20, glucose 277, patient   started on glucose stabilizer drip. Hemoglobin A1c pending   . Elevated LFTs - in the setting of alcohol abuse, will follow LFT, order hepatitis panel. Could be secondary to mild elevation in CK 822  . Coffee  ground emesis - Protonix iv , hemoglobin currently stable, Suspect Mallory-Weiss tear versus alcoholic gastritis     . Alcohol withdrawal syndrome with complication, with unspecified complication (HCC) - CIWA protocol  . Dehydration - rehydrate      DVT prophylaxsis SCD  Code Status:  Full code   Family Communication: Discussed in detail with the patient, all imaging results, lab results explained to the patient   Disposition Plan: Continue stepdown, may dc in am if stable       Consultants:  None  Procedures:  None  Antibiotics: Anti-infectives    None         HPI/Subjective: Awake and alert,ambulating , daughter by the bedside   Objective: Vitals:   12/15/16 0330 12/15/16 0400 12/15/16 0429 12/15/16 0733  BP: 129/85 123/78 (!) 133/91   Pulse: (!) 112 (!) 113 (!) 115   Resp: 20 18 20   Temp:   98.3 F (36.8 C) 97.8 F (36.6 C)  TempSrc:   Oral Oral  SpO2: 96% 100% 100%   Weight:   70.2 kg (154 lb 12.2 oz)   Height:   5\' 5" (1.651 m)     Intake/Output Summary (Last 24 hours) at 12/15/16 0822 Last data filed at 12/15/16 0600  Gross per 24 hour  Intake          3330.92 ml  Output              650 ml  Net          26 80.92 ml    Exam:  Examination:  General exam: Appears calm and comfortable  Respiratory system:  Clear to auscultation. Respiratory effort normal. Cardiovascular system: S1 & S2 heard, RRR. No JVD, murmurs, rubs, gallops or clicks. No pedal edema. Gastrointestinal system: Abdomen is nondistended, soft and nontender. No organomegaly or masses felt. Normal bowel sounds heard. Central nervous system: Alert and oriented. No focal neurological deficits. Extremities: Symmetric 5 x 5 power. Skin: No rashes, lesions or ulcers Psychiatry: Judgement and insight appear normal. Mood & affect appropriate.     Data Reviewed: I have personally reviewed following labs and imaging studies  Micro Results Recent Results (from the past 240  hour(s))  MRSA PCR Screening     Status: None   Collection Time: 12/15/16  4:25 AM  Result Value Ref Range Status   MRSA by PCR NEGATIVE NEGATIVE Final    Comment:        The GeneXpert MRSA Assay (FDA approved for NASAL specimens only), is one component of a comprehensive MRSA colonization surveillance program. It is not intended to diagnose MRSA infection nor to guide or monitor treatment for MRSA infections.     Radiology Reports [place] Abdomen Limited Ruq  Result Date: 12/15/2016 CLINICAL DATA:  Elevated liver function studies today. Nausea, vomiting, and abdominal pain for 4 days. History of diabetes and alcohol abuse. EXAM: ULTRASOUND ABDOMEN LIMITED RIGHT UPPER QUADRANT COMPARISON:  None. FINDINGS: Gallbladder: No gallstones or wall thickening visualized. No sonographic Murphy sign noted by sonographer. Common bile duct: Diameter: 3.2 mm, normal Liver: Diffusely increased hepatic parenchymal echotexture suggesting fatty infiltration. IMPRESSION: No evidence of cholelithiasis or cholecystitis. Mild diffuse fatty infiltration of the liver. Electronically Signed   By: Burman NievesWilliam  Stevens M.D.   On: 12/15/2016 02:21     CBC  Recent Labs Lab 12/14/16 1830 12/15/16 0429  WBC 11.8* 11.6*  HGB 16.4 14.5  HCT 44.8 40.8  PLT 247 197  MCV 93.9 94.0  MCH 34.4* 33.4  MCHC 36.6* 35.5  RDW 12.1 12.2    Chemistries   Recent Labs Lab 12/14/16 1830 12/14/16 2310 12/15/16 0429  NA 119* 124* 127*  K 4.3 4.2 3.8  CL 86* 93* 99*  CO2 13* 13* 13*  GLUCOSE 277* 230* 191*  BUN 18 17 15   CREATININE 1.15 1.13 1.16  CALCIUM 8.9 8.6* 8.1*  MG  --   --  2.2  AST 115*  --  77*  ALT 80*  --  59  ALKPHOS 100  --  76  BILITOT 1.8*  --  1.5*   ------------------------------------------------------------------------------------------------------------------ estimated creatinine clearance is 75.1 mL/min (by C-G formula based on SCr of 1.16  mg/dL). ------------------------------------------------------------------------------------------------------------------ No results for input(s): HGBA1C in the last 72 hours. ------------------------------------------------------------------------------------------------------------------ No results for input(s): CHOL, HDL, LDLCALC, TRIG, CHOLHDL, LDLDIRECT in the last 72 hours. ------------------------------------------------------------------------------------------------------------------  Recent Labs  12/15/16 0252  TSH 1.010   ------------------------------------------------------------------------------------------------------------------ No results for input(s): VITAMINB12, FOLATE, FERRITIN, TIBC, IRON, RETICCTPCT in the last 72 hours.  Coagulation profile  Recent Labs Lab 12/14/16 2310  INR 1.10    No results for input(s): DDIMER in the last 72 hours.  Cardiac Enzymes  Recent Labs Lab 12/14/16 2310 12/15/16 0429  TROPONINI 0.03* <0.03   ------------------------------------------------------------------------------------------------------------------ Invalid input(s): POCBNP   CBG:  Recent Labs Lab 12/15/16 0242 12/15/16 0403 12/15/16 0505 12/15/16 0615  GLUCAP 224* 206* 160* 158*       Studies: [place] Abdomen Limited Ruq  Result Date: 12/15/2016 CLINICAL DATA:  Elevated liver function studies today. Nausea, vomiting, and abdominal pain for 4 days. History of diabetes and alcohol abuse. EXAM: ULTRASOUND  ABDOMEN LIMITED RIGHT UPPER QUADRANT COMPARISON:  None. FINDINGS: Gallbladder: No gallstones or wall thickening visualized. No sonographic Murphy sign noted by sonographer. Common bile duct: Diameter: 3.2 mm, normal Liver: Diffusely increased hepatic parenchymal echotexture suggesting fatty infiltration. IMPRESSION: No evidence of cholelithiasis or cholecystitis. Mild diffuse fatty infiltration of the liver. Electronically Signed   By: Burman Nieves M.D.    On: 12/15/2016 02:21      No results found for: HGBA1C Lab Results  Component Value Date   CREATININE 1.16 12/15/2016       Scheduled Meds: . folic acid  1 mg Intravenous Daily  . insulin regular  0-10 Units Intravenous TID WC  . [START ON 12/18/2016] pantoprazole  40 mg Intravenous Q12H  . phosphorus  500 mg Oral BID  . thiamine  100 mg Intravenous Daily   Continuous Infusions: . sodium chloride    . sodium chloride 150 mL/hr at 12/15/16 0150  . dextrose 5 % and 0.45% NaCl 125 mL/hr at 12/15/16 0259  . insulin (NOVOLIN-R) infusion 2 Units/hr (12/15/16 0511)  . pantoprozole (PROTONIX) infusion 8 mg/hr (12/14/16 2345)  . potassium phosphate IVPB (mmol) 20 mmol (12/15/16 0658)     LOS: 1 day    Time spent: >30 MINS    Richarda Overlie  [facility] Pager [phone]. If 7PM-7AM, please contact night-coverage at [email or web address], password [facility] 12/15/2016, 8:22 AM  LOS: 1 day

## 2016-12-15 NOTE — Care Management Note (Addendum)
Case Management Note  Patient Details  Name: Namon Cirrintonio Bailon Roman MRN: [id number] Date of Birth: [birth year]  Subjective/Objective:    Pt admitted with abd pain, ETOH and ingestion of rubbing alcohol                Action/Plan:   PTA from home - reportedly drinks 6 beers a day and on 7/29  pt ingested rubbing alcohol - CSW consulted for ongoing substance abuse.  Pt is also an untreated diabetic - diabetic coordinator consulted. Pt will need PCP appt prior to discharge and likely MATCH.  CM will continue to follow for discharge needs   Expected Discharge Date:                  Expected Discharge Plan:  Home/Self Care  In-House Referral:  Clinical Social Work  Discharge planning Services  CM Consult  Post Acute Care Choice:    Choice offered to:     DME Arranged:    DME Agency:     HH Arranged:    HH Agency:     Status of Service:     If discussed at [employer] Meetings, dates discussed:    Additional Comments:  Cherylann ParrClaxton, Aslyn Cottman S, RN 12/15/2016, 8:48 AM

## 2016-12-15 NOTE — Progress Notes (Signed)
Inpatient Diabetes Program Recommendations  AACE/ADA: New Consensus Statement on Inpatient Glycemic Control (2015)  Target Ranges:  Prepandial:   less than 140 mg/dL      Peak postprandial:   less than 180 mg/dL (1-2 hours)      Critically ill patients:  140 - 180 mg/dL   Lab Results  Component Value Date   GLUCAP 185 (H) 12/15/2016    Review of Glycemic Control  Inpatient Diabetes Program Recommendations:  Received DM consult. Recommend continuing IV insulin drip over 24 hrs to determine insulin needs. Will coordinate diabetes education. Nurses, please start education as soon as appropriate with patient.  Thank you, Billy FischerJudy E. Danni Shima, RN, MSN, CDE  Diabetes Coordinator Inpatient Glycemic Control Team Team Pager [phone] (8am-5pm) 12/15/2016 9:02 AM

## 2016-12-16 DIAGNOSIS — E1110 Type 2 diabetes mellitus with ketoacidosis without coma: Secondary | ICD-10-CM

## 2016-12-16 LAB — GLUCOSE, CAPILLARY
GLUCOSE-CAPILLARY: 121 mg/dL — AB (ref 65–99)
GLUCOSE-CAPILLARY: 170 mg/dL — AB (ref 65–99)
GLUCOSE-CAPILLARY: 188 mg/dL — AB (ref 65–99)
GLUCOSE-CAPILLARY: 185 mg/dL — AB (ref 65–99)
GLUCOSE-CAPILLARY: 135 mg/dL — AB (ref 65–99)
GLUCOSE-CAPILLARY: 191 mg/dL — AB (ref 65–99)
GLUCOSE-CAPILLARY: 151 mg/dL — AB (ref 65–99)
GLUCOSE-CAPILLARY: 159 mg/dL — AB (ref 65–99)
GLUCOSE-CAPILLARY: 155 mg/dL — AB (ref 65–99)
GLUCOSE-CAPILLARY: 154 mg/dL — AB (ref 65–99)
GLUCOSE-CAPILLARY: 218 mg/dL — AB (ref 65–99)
Glucose-Capillary: 107 mg/dL — ABNORMAL HIGH (ref 65–99)
Glucose-Capillary: 139 mg/dL — ABNORMAL HIGH (ref 65–99)
Glucose-Capillary: 152 mg/dL — ABNORMAL HIGH (ref 65–99)
Glucose-Capillary: 158 mg/dL — ABNORMAL HIGH (ref 65–99)
Glucose-Capillary: 158 mg/dL — ABNORMAL HIGH (ref 65–99)
Glucose-Capillary: 161 mg/dL — ABNORMAL HIGH (ref 65–99)
Glucose-Capillary: 165 mg/dL — ABNORMAL HIGH (ref 65–99)
Glucose-Capillary: 170 mg/dL — ABNORMAL HIGH (ref 65–99)
Glucose-Capillary: 183 mg/dL — ABNORMAL HIGH (ref 65–99)
Glucose-Capillary: 184 mg/dL — ABNORMAL HIGH (ref 65–99)
Glucose-Capillary: 320 mg/dL — ABNORMAL HIGH (ref 65–99)

## 2016-12-16 LAB — HEMOGLOBIN A1C
HEMOGLOBIN A1C: 12.8 % — AB (ref 4.8–5.6)
MEAN PLASMA GLUCOSE: 321 mg/dL

## 2016-12-16 LAB — BASIC METABOLIC PANEL
ANION GAP: 7 (ref 5–15)
BUN: <5 mg/dL — ABNORMAL LOW (ref 6–20)
CALCIUM: 8.2 mg/dL — AB (ref 8.9–10.3)
CO2: 24 mmol/L (ref 22–32)
CREATININE: 0.48 mg/dL — AB (ref 0.61–1.24)
Chloride: 104 mmol/L (ref 101–111)
GLUCOSE: 147 mg/dL — AB (ref 65–99)
Potassium: 2.6 mmol/L — CL (ref 3.5–5.1)
Sodium: 135 mmol/L (ref 135–145)

## 2016-12-16 LAB — HEPATITIS PANEL, ACUTE
HEP A IGM: NEGATIVE
HEP B S AG: NEGATIVE
Hep B C IgM: NEGATIVE

## 2016-12-16 LAB — COMPREHENSIVE METABOLIC PANEL
ALK PHOS: 59 U/L (ref 38–126)
ALT: 56 U/L (ref 17–63)
ANION GAP: 7 (ref 5–15)
AST: 91 U/L — ABNORMAL HIGH (ref 15–41)
Albumin: 3.1 g/dL — ABNORMAL LOW (ref 3.5–5.0)
BUN: <5 mg/dL — ABNORMAL LOW (ref 6–20)
CALCIUM: 7.8 mg/dL — AB (ref 8.9–10.3)
CO2: 21 mmol/L — ABNORMAL LOW (ref 22–32)
CREATININE: 0.56 mg/dL — AB (ref 0.61–1.24)
Chloride: 107 mmol/L (ref 101–111)
Glucose, Bld: 163 mg/dL — ABNORMAL HIGH (ref 65–99)
Potassium: 2.5 mmol/L — CL (ref 3.5–5.1)
SODIUM: 135 mmol/L (ref 135–145)
Total Bilirubin: 0.7 mg/dL (ref 0.3–1.2)
Total Protein: 6.3 g/dL — ABNORMAL LOW (ref 6.5–8.1)

## 2016-12-16 LAB — CBC
HEMATOCRIT: 36.3 % — AB (ref 39.0–52.0)
Hemoglobin: 13 g/dL (ref 13.0–17.0)
MCH: 33.6 pg (ref 26.0–34.0)
MCHC: 35.8 g/dL (ref 30.0–36.0)
MCV: 93.8 fL (ref 78.0–100.0)
PLATELETS: 196 10*3/uL (ref 150–400)
RBC: 3.87 MIL/uL — ABNORMAL LOW (ref 4.22–5.81)
RDW: 12.7 % (ref 11.5–15.5)
WBC: 6.1 10*3/uL (ref 4.0–10.5)

## 2016-12-16 LAB — C-PEPTIDE: C PEPTIDE: 1.7 ng/mL (ref 1.1–4.4)

## 2016-12-16 LAB — PHOSPHORUS: PHOSPHORUS: 1 mg/dL — AB (ref 2.5–4.6)

## 2016-12-16 LAB — CK: CK TOTAL: 248 U/L (ref 49–397)

## 2016-12-16 MED ORDER — POTASSIUM CHLORIDE CRYS ER 20 MEQ PO TBCR
40.0000 meq | EXTENDED_RELEASE_TABLET | Freq: Two times a day (BID) | ORAL | Status: AC
  Administered 2016-12-16 – 2016-12-17 (×3): 40 meq via ORAL
  Filled 2016-12-16 (×3): qty 2

## 2016-12-16 MED ORDER — INSULIN ASPART 100 UNIT/ML ~~LOC~~ SOLN
0.0000 [IU] | Freq: Three times a day (TID) | SUBCUTANEOUS | Status: DC
Start: 2016-12-17 — End: 2016-12-17
  Administered 2016-12-17: 3 [IU] via SUBCUTANEOUS

## 2016-12-16 MED ORDER — ALUM & MAG HYDROXIDE-SIMETH 200-200-20 MG/5ML PO SUSP
15.0000 mL | ORAL | Status: DC | PRN
  Administered 2016-12-16 (×2): 15 mL via ORAL
  Filled 2016-12-16 (×2): qty 30

## 2016-12-16 MED ORDER — POTASSIUM PHOSPHATE MONOBASIC 500 MG PO TABS
500.0000 mg | ORAL_TABLET | Freq: Three times a day (TID) | ORAL | Status: DC
  Filled 2016-12-16 (×2): qty 1

## 2016-12-16 MED ORDER — INSULIN GLARGINE 100 UNIT/ML ~~LOC~~ SOLN
10.0000 [IU] | Freq: Every day | SUBCUTANEOUS | Status: DC
  Administered 2016-12-16: 10 [IU] via SUBCUTANEOUS
  Filled 2016-12-16 (×2): qty 0.1

## 2016-12-16 MED ORDER — INSULIN ASPART 100 UNIT/ML ~~LOC~~ SOLN
6.0000 [IU] | Freq: Three times a day (TID) | SUBCUTANEOUS | Status: DC
  Administered 2016-12-17 (×2): 6 [IU] via SUBCUTANEOUS

## 2016-12-16 MED ORDER — K PHOS MONO-SOD PHOS DI & MONO 155-852-130 MG PO TABS
500.0000 mg | ORAL_TABLET | Freq: Three times a day (TID) | ORAL | Status: DC
  Administered 2016-12-16 – 2016-12-17 (×4): 500 mg via ORAL
  Filled 2016-12-16 (×7): qty 2

## 2016-12-16 MED ORDER — POTASSIUM CHLORIDE CRYS ER 20 MEQ PO TBCR
40.0000 meq | EXTENDED_RELEASE_TABLET | Freq: Once | ORAL | Status: AC
  Administered 2016-12-16: 40 meq via ORAL
  Filled 2016-12-16: qty 2

## 2016-12-16 NOTE — Progress Notes (Signed)
[facility] PROGRESS NOTE  Richard Morgan RUE:[id number] DOB: 07-24-1975 DOA: 12/14/2016   PCP: Patient, No Pcp Per     Assessment/Plan: Active Problems:   Hyponatremia   Isopropyl alcohol poisoning   Hyperglycemia   Elevated LFTs   Coffee ground emesis   Alcohol withdrawal syndrome with complication, with unspecified complication (HCC)   Dehydration   DKA (diabetic ketoacidoses) (HCC)   41 y.o. Gentleman with medical history significant for Alcohol abuse, diagnosed with DM2 3 years ago but never followed-up, presenting with 3 day h/o epigastric pain, nausea and coffee grounds vomiting after ingestion of rubbing alcohol(isopropyl alcohol) because he did not have beer. He was said to be briefly confused prior to admission for Alcohol Withdrawal, Isopropyl Alcohol ingestion, upper GI Bleeding, DKA, and electrolyte imbalance (severe hyponatremia, hyperphosphatemia)    Assessment and plan  #1 Diabetic Ketoacidosis: AG- 20,  Gluc 277  A1c- 12.8 Continue IV fluids with IV insulin Switch to subcutaneous insulin and metformin when DKA resolves Diabetic education prior to discharge    #2 Severe Hyponatremia: 119 on admission Suspect beer potomania. Gentle IV saline rehydration Serial BMEP's Seizure precaution  #3 Isopropyl Alcohol Ingestion/Poisoning: Supportive management Monitor osmolal gap Poison control notified and following  #4 Hypophosphatemia: Replete and follow phosphorus levels    #5 Elevated LFTs:  Due to [facility] abuse  Hep panel   Assoc mild elevation of CK 822  Liver USG - mild diffuse fatty liver  #6 Upper GI Bleeding: Suspect Alcoholic Gastritis Serial H/H Protonix gtt I Supportive Care GI consulted by ED on Admit   #7 Alcohol Withdrawal: CIWA protocol Out-Pt ETOH Program on D/C  #8 Rhabdomyolysis: Mild Cont IV rehydration Monitor electrolytes/renal fxn  #9 Dehydration: IV rehydration Monitor I/O, electrolytes/renal  fxn       DVT prophylaxsis SCD  Code Status:  Full code   Family Communication: Discussed in detail with the patient, all imaging results, lab results explained to the patient   Disposition Plan:  transfer to telemetry when stable    Consultants:    Procedures:    Antibiotics: Anti-infectives    None         HPI/Subjective:Patient was seen on rounds. Admitting H&P as well as ED notes reviewed and noted. No acute events but overnight. Patient seems to be progressing well. No fever or chills. No seizure activities. No hematemesis. No dizziness   Objective: Vitals:   12/15/16 1930 12/15/16 2300 12/16/16 0340 12/16/16 0741  BP: 124/75 127/83 (!) 141/78   Pulse: 89 87 83   Resp: 14 15 (!) 21 16  Temp: 98.2 F (36.8 C) 98.3 F (36.8 C) 98.3 F (36.8 C) 98.5 F (36.9 C)  TempSrc: Oral Oral Oral Oral  SpO2: 100% 100% 100%   Weight:      Height:        Intake/Output Summary (Last 24 hours) at 12/16/16 0926 Last data filed at 12/16/16 0744  Gross per 24 hour  Intake          4010.29 ml  Output             2025 ml  Net          1985.29 ml    Exam:  Examination:  General exam: NAD, comfortable  Respiratory system: Clear to auscultation. Respiratory effort normal. Cardiovascular system: S1 & S2 heard, RRR. No JVD, murmurs, rubs, gallops or clicks. No pedal edema. Gastrointestinal system: Abdomen is nondistended, soft and nontender. No organomegaly  or masses felt. Normal bowel sounds heard. Central nervous system: Alert and oriented. No focal neurological deficits. Extremities: Symmetric 5 x 5 power. Skin: No rashes, lesions or ulcers Psychiatry: Judgement and insight appear normal. Mood & affect appropriate.     Data Reviewed: I have personally reviewed following labs and imaging studies  Micro Results Recent Results (from the past 240 hour(s))  MRSA PCR Screening     Status: None   Collection Time: 12/15/16  4:25 AM  Result Value Ref Range Status    MRSA by PCR NEGATIVE NEGATIVE Final    Comment:        The GeneXpert MRSA Assay (FDA approved for NASAL specimens only), is one component of a comprehensive MRSA colonization surveillance program. It is not intended to diagnose MRSA infection nor to guide or monitor treatment for MRSA infections.     Radiology Reports [place] Abdomen Limited Ruq  Result Date: 12/15/2016 CLINICAL DATA:  Elevated liver function studies today. Nausea, vomiting, and abdominal pain for 4 days. History of diabetes and alcohol abuse. EXAM: ULTRASOUND ABDOMEN LIMITED RIGHT UPPER QUADRANT COMPARISON:  None. FINDINGS: Gallbladder: No gallstones or wall thickening visualized. No sonographic Murphy sign noted by sonographer. Common bile duct: Diameter: 3.2 mm, normal Liver: Diffusely increased hepatic parenchymal echotexture suggesting fatty infiltration. IMPRESSION: No evidence of cholelithiasis or cholecystitis. Mild diffuse fatty infiltration of the liver. Electronically Signed   By: Burman NievesWilliam  Stevens M.D.   On: 12/15/2016 02:21     CBC  Recent Labs Lab 12/14/16 1830 12/15/16 0429 12/16/16 0610  WBC 11.8* 11.6* 6.1  HGB 16.4 14.5 13.0  HCT 44.8 40.8 36.3*  PLT 247 197 196  MCV 93.9 94.0 93.8  MCH 34.4* 33.4 33.6  MCHC 36.6* 35.5 35.8  RDW 12.1 12.2 12.7    Chemistries   Recent Labs Lab 12/14/16 1830 12/14/16 2310 12/15/16 0429 12/16/16 0751  NA 119* 124* 127* 135  K 4.3 4.2 3.8 2.5*  CL 86* 93* 99* 107  CO2 13* 13* 13* 21*  GLUCOSE 277* 230* 191* 163*  BUN 18 17 15  <5*  CREATININE 1.15 1.13 1.16 0.56*  CALCIUM 8.9 8.6* 8.1* 7.8*  MG  --   --  2.2  --   AST 115*  --  77* 91*  ALT 80*  --  59 56  ALKPHOS 100  --  76 59  BILITOT 1.8*  --  1.5* 0.7   ------------------------------------------------------------------------------------------------------------------ estimated creatinine clearance is 108.9 mL/min (A) (by C-G formula based on SCr of 0.56 mg/dL  (L)). ------------------------------------------------------------------------------------------------------------------  Recent Labs  12/15/16 0429  HGBA1C 12.8*   ------------------------------------------------------------------------------------------------------------------ No results for input(s): CHOL, HDL, LDLCALC, TRIG, CHOLHDL, LDLDIRECT in the last 72 hours. ------------------------------------------------------------------------------------------------------------------  Recent Labs  12/15/16 0252  TSH 1.010   ------------------------------------------------------------------------------------------------------------------ No results for input(s): VITAMINB12, FOLATE, FERRITIN, TIBC, IRON, RETICCTPCT in the last 72 hours.  Coagulation profile  Recent Labs Lab 12/14/16 2310  INR 1.10    No results for input(s): DDIMER in the last 72 hours.  Cardiac Enzymes  Recent Labs Lab 12/14/16 2310 12/15/16 0429 12/15/16 1043  TROPONINI 0.03* <0.03 <0.03   ------------------------------------------------------------------------------------------------------------------ Invalid input(s): POCBNP   CBG:  Recent Labs Lab 12/16/16 0414 12/16/16 0517 12/16/16 0621 12/16/16 0728 12/16/16 0832  GLUCAP 170* 188* 170* 185* 135*       Studies: [place] Abdomen Limited Ruq  Result Date: 12/15/2016 CLINICAL DATA:  Elevated liver function studies today. Nausea, vomiting, and abdominal pain for 4 days. History of diabetes and alcohol  abuse. EXAM: ULTRASOUND ABDOMEN LIMITED RIGHT UPPER QUADRANT COMPARISON:  None. FINDINGS: Gallbladder: No gallstones or wall thickening visualized. No sonographic Murphy sign noted by sonographer. Common bile duct: Diameter: 3.2 mm, normal Liver: Diffusely increased hepatic parenchymal echotexture suggesting fatty infiltration. IMPRESSION: No evidence of cholelithiasis or cholecystitis. Mild diffuse fatty infiltration of the liver. Electronically  Signed   By: Burman NievesWilliam  Stevens M.D.   On: 12/15/2016 02:21      Lab Results  Component Value Date   HGBA1C 12.8 (H) 12/15/2016   Lab Results  Component Value Date   CREATININE 0.56 (L) 12/16/2016       Scheduled Meds: . folic acid  1 mg Intravenous Daily  . insulin regular  0-10 Units Intravenous TID WC  . [START ON 12/18/2016] pantoprazole  40 mg Intravenous Q12H  . potassium chloride  40 mEq Oral BID  . potassium phosphate (monobasic)  500 mg Oral TID WC & HS  . thiamine  100 mg Intravenous Daily   Continuous Infusions: . dextrose 5 % and 0.45% NaCl 125 mL/hr at 12/16/16 0207  . insulin (NOVOLIN-R) infusion 1.1 Units/hr (12/16/16 0620)  . pantoprozole (PROTONIX) infusion 8 mg/hr (12/15/16 2107)     LOS: 2 days    Time spent: >45 MINS    OSEI-BONSU,Leevi Cullars  [facility] Pager [phone] If 7PM-7AM, please contact night-coverage at [email or web address], password [email or web address] 12/16/2016, 9:26 AM  LOS: 2 days

## 2016-12-17 DIAGNOSIS — E860 Dehydration: Secondary | ICD-10-CM

## 2016-12-17 LAB — GLUCOSE, CAPILLARY
GLUCOSE-CAPILLARY: 165 mg/dL — AB (ref 65–99)
Glucose-Capillary: 107 mg/dL — ABNORMAL HIGH (ref 65–99)

## 2016-12-17 LAB — BASIC METABOLIC PANEL
Anion gap: 8 (ref 5–15)
BUN: <5 mg/dL — ABNORMAL LOW (ref 6–20)
CHLORIDE: 103 mmol/L (ref 101–111)
CO2: 25 mmol/L (ref 22–32)
CREATININE: 0.51 mg/dL — AB (ref 0.61–1.24)
Calcium: 8.4 mg/dL — ABNORMAL LOW (ref 8.9–10.3)
Glucose, Bld: 168 mg/dL — ABNORMAL HIGH (ref 65–99)
POTASSIUM: 2.9 mmol/L — AB (ref 3.5–5.1)
SODIUM: 136 mmol/L (ref 135–145)

## 2016-12-17 LAB — CK: CK TOTAL: 155 U/L (ref 49–397)

## 2016-12-17 MED ORDER — METFORMIN HCL 850 MG PO TABS: 850.0000 mg | ORAL_TABLET | Freq: Two times a day (BID) | ORAL | 11 refills | Status: DC

## 2016-12-17 MED ORDER — POTASSIUM CHLORIDE CRYS ER 20 MEQ PO TBCR
40.0000 meq | EXTENDED_RELEASE_TABLET | Freq: Three times a day (TID) | ORAL | Status: DC
  Filled 2016-12-17: qty 2

## 2016-12-17 MED ORDER — PANTOPRAZOLE SODIUM 40 MG PO TBEC: 40.0000 mg | DELAYED_RELEASE_TABLET | Freq: Two times a day (BID) | ORAL | 1 refills | Status: DC

## 2016-12-17 MED ORDER — VITAMIN B-1 100 MG PO TABS: 100.0000 mg | ORAL_TABLET | Freq: Every day | ORAL | 0 refills | Status: DC

## 2016-12-17 MED ORDER — FOLIC ACID 1 MG PO TABS: 1.0000 mg | ORAL_TABLET | Freq: Every day | ORAL | 0 refills | Status: AC

## 2016-12-17 MED ORDER — LIVING WELL WITH DIABETES BOOK - IN SPANISH
Freq: Once | Status: DC
  Filled 2016-12-17: qty 1

## 2016-12-17 MED ORDER — POTASSIUM CHLORIDE ER 10 MEQ PO TBCR: 20.0000 meq | EXTENDED_RELEASE_TABLET | Freq: Every day | ORAL | 0 refills | Status: DC

## 2016-12-17 MED ORDER — K PHOS MONO-SOD PHOS DI & MONO 155-852-130 MG PO TABS: 500.0000 mg | ORAL_TABLET | Freq: Two times a day (BID) | ORAL | 0 refills | Status: DC

## 2016-12-17 NOTE — Discharge Summary (Signed)
[name], is a 41 y.o. male  DOB 03/15/1976  MRN [id number].  Admission date:  12/14/2016  Admitting Physician  Therisa Doyne, MD  Discharge Date:  12/17/2016   Primary MD  Patient, No Pcp Per  Recommendations for primary care physician for things to follow:  BMET to follow-up on hypokalemia Phosphorus and magnesium levels Creatinine kinase AST  Admission Diagnosis  Hyponatremia [E87.1] Elevated LFTs [R79.89] Accidental poisoning by isopropyl alcohol, initial encounter [T51.2X1A]   Discharge Diagnosis  Hyponatremia [E87.1] Elevated LFTs [R79.89] Accidental poisoning by isopropyl alcohol, initial encounter [T51.2X1A]    Active Problems:   Hyponatremia   Isopropyl alcohol poisoning   Hyperglycemia   Elevated LFTs   Coffee ground emesis   Alcohol withdrawal syndrome with complication, with unspecified complication (HCC)   Dehydration   DKA (diabetic ketoacidoses) (HCC)      Past Medical History:  Diagnosis Date  . Diabetes mellitus without complication (HCC)     History reviewed. No pertinent surgical history.     HPI  from the history and physical done on the day of admission:   Richard Morgan is a 41 y.o. male with medical history significant of Alcohol abuse and DM    Presented with 4 day history of nausea vomiting abdominal pain and epigastric region, vomitus significant for coffee-ground emesis. Patient drinks an regular basis about 6 beers per day. Sunday family noted that he drank a whole bottle of isopropyl alcohol because he couldn't get to regular alcohol.  Patient states he did not notice make him sick. He was noted to be severely confused thereafter. Patient had not had alcohol since Monday for past 3 days. Patient is no primary care provider and unaware about any past medical history. He denies history of seizures or DTs in the past but does reports  tremors.  He states 3 years ago he was told that he was diabetic but never followed up.   IN ER:  Temp (24hrs), Avg:98.5 F (36.9 C), Min:98.5 F (36.9 C), Max:98.5 F (36.9 C)      on arrival      ED Triage Vitals  Enc Vitals Group     BP 12/14/16 1816 (!) 135/93     Pulse Rate 12/14/16 1816 (!) 136     Resp 12/14/16 1816 16     Temp 12/14/16 1816 98.5 F (36.9 C)     Temp Source 12/14/16 1816 Oral     SpO2 12/14/16 1816 98 %     Weight 12/14/16 1817 160 lb (72.6 kg)     Height 12/14/16 1817 5\' 1"  (1.549 m)     Head Circumference --      Peak Flow --      Pain Score 12/14/16 1821 10     Pain Loc --      Pain Edu? --      Excl. in GC? --    RR 19 HR 115 BP 138/105 LA 1.69 Lipase 31 Na 119 K 4.3 Bicarb 13 protein 9.1 alb  4.7 AG 20 AST 115 ALT 80  WBC 11.8 Hg 16.4 PLT 247   Following Medications were ordered in ER: Medications  0.9 %  sodium chloride infusion ( Intravenous New Bag/Given 12/14/16 2006)  pantoprazole (PROTONIX) 80 mg in sodium chloride 0.9 % 100 mL IVPB (not administered)  ondansetron (ZOFRAN-ODT) disintegrating tablet 4 mg (4 mg Oral Given 12/14/16 1824)  ondansetron (ZOFRAN) injection 4 mg (4 mg Intravenous Given 12/14/16 2009)  famotidine (PEPCID) IVPB 20 mg premix (0 mg Intravenous Stopped 12/14/16 2145)  LORazepam (ATIVAN) injection 1 mg (1 mg Intravenous Given 12/14/16 2112)     ER provider discussed case with: Poison control center who recommends supportive management recommends obtaining a Ethelene glycol and methanol levels in addition to salicylate, acetaminophen, baseline EKG.. And GI who will see in consult in AM Hospitalist was called for admission for alcohol withdrawal, isopropyl alcohol ingestion, coffee-ground emesis     Morgan Course:    41 y.o. Gentleman with medical history significant for Alcohol abuse, diagnosed with DM2 3 years ago but never followed-up, presenting with 3 day h/o epigastric pain, nausea and coffee grounds  vomiting after ingestion of rubbing alcohol(isopropyl alcohol) because he did not have beer. He was said to be briefly confused prior to admission forAlcohol Withdrawal, Isopropyl Alcohol ingestion, upper GI Bleeding, DKA, and electrolyte imbalance (severe hyponatremia, hyperphosphatemia)     #2 Severe Hyponatremia: 119 on admission Suspect beer potomania. Gentle IV saline rehydration Serial BMEP's Seizure precaution Resolved at time of discharge  #3 Isopropyl Alcohol Ingestion/Poisoning: Supportive management Monitor osmolal gap Poison control notified and following Resolved at time of discharge with supportive care  #4 Hypophosphatemia: Replete and follow phosphorus levels    #5 Elevated LFTs:  [facility] to [facility] abuse  Hep panel   Assoc mild elevation of CK 822  Liver USG - mild diffuse fatty liver  #6 Upper GI Bleeding: Suspect Alcoholic Gastritis Serial H/H Protonix gtt I Supportive Care GI consulted by ED on Admit   #7 Alcohol Withdrawal: CIWA protocol Out-Pt ETOH Program on D/C Resolved at time of discharge  #8 Rhabdomyolysis: Mild Cont IV rehydration Monitor electrolytes/renal fxn  #9 Dehydration: IV rehydration Monitor I/O, electrolytes/renal fxn       DVT prophylaxsis SCD  Code Status:  Full code   Family Communication: Discussed in detail with the patient, all imaging results, lab results explained to the patient   Disposition Plan:  transfer to telemetry when stable    Consultants:  [employer] [place] poison control  Procedures:    Antibiotics:    Anti-infectives    None        Discharge Condition: Stable and satisfactory  Follow UP -establish primary care with [facility]     [facility]  Diet and Activity recommendation:  As advised  Discharge Instructions     Discharge Instructions    Call MD for:  difficulty breathing, headache  or visual disturbances    Complete by:  As directed    Call MD for:  extreme fatigue    Complete by:  As directed    Call MD for:  hives    Complete by:  As directed    Call MD for:  persistant dizziness or light-headedness    Complete by:  As directed    Call MD for:  persistant nausea and vomiting    Complete by:  As directed    Call MD for:  redness, tenderness, or signs  of infection (pain, swelling, redness, odor or green/yellow discharge around incision site)    Complete by:  As directed    Call MD for:  severe uncontrolled pain    Complete by:  As directed    Call MD for:  temperature >100.4    Complete by:  As directed    Diet Carb Modified    Complete by:  As directed    Increase activity slowly    Complete by:  As directed         Discharge Medications     Allergies as of 12/17/2016   No Known Allergies     Medication List    TAKE these medications   calcium carbonate 500 MG chewable tablet Commonly known as:  TUMS - dosed in mg elemental calcium Chew 1-2 tablets by mouth 3 (three) times daily as needed for indigestion or heartburn.   folic acid 1 MG tablet Commonly known as:  FOLVITE Take 1 tablet (1 mg total) by mouth daily.   metFORMIN 850 MG tablet Commonly known as:  GLUCOPHAGE Take 1 tablet (850 mg total) by mouth 2 (two) times daily with a meal.   pantoprazole 40 MG tablet Commonly known as:  PROTONIX Take 1 tablet (40 mg total) by mouth 2 (two) times daily.   phosphorus 155-852-130 MG tablet Commonly known as:  K PHOS NEUTRAL Take 2 tablets (500 mg total) by mouth 2 (two) times daily.   potassium chloride 10 MEQ tablet Commonly known as:  K-DUR Take 2 tablets (20 mEq total) by mouth daily.   thiamine 100 MG tablet Commonly known as:  VITAMIN B-1 Take 1 tablet (100 mg total) by mouth daily.       Major procedures and Radiology Reports -  US Abdomen Limited Ruq  Result Date: 12/15/2016 CLINICAL DATA:  Elevated liver function studies  today. Nausea, vomiting, and abdominal pain for 4 days. History of diabetes and alcohol abuse. EXAM: ULTRASOUND ABDOMEN LIMITED RIGHT UPPER QUADRANT COMPARISON:  None. FINDINGS: Gallbladder: No gallstones or wall thickening visualized. No sonographic Murphy sign noted by sonographer. Common bile duct: Diameter: 3.2 mm, normal Liver: Diffusely increased hepatic parenchymal echotexture suggesting fatty infiltration. IMPRESSION: No evidence of cholelithiasis or cholecystitis. Mild diffuse fatty infiltration of the liver. Electronically Signed   By: Burman Nieves M.D.   On: 12/15/2016 02:21    Micro Results     Recent Results (from the past 240 hour(s))  MRSA PCR Screening     Status: None   Collection Time: 12/15/16  4:25 AM  Result Value Ref Range Status   MRSA by PCR NEGATIVE NEGATIVE Final    Comment:        The GeneXpert MRSA Assay (FDA approved for NASAL specimens only), is one component of a comprehensive MRSA colonization surveillance program. It is not intended to diagnose MRSA infection nor to guide or monitor treatment for MRSA infections.        Today   Subjective    Richard Morgan today has no nausea vomiting. No fever or chills. No chest pain, no shortness of breath. Patient has been seen and examined prior to discharge   Objective   Blood pressure (!) 140/99, pulse 66, temperature 98.6 F (37 C), temperature source Oral, resp. rate 15, height 5\' 5"  (1.651 m), weight 70.2 kg (154 lb 12.2 oz), SpO2 98 %.   Intake/Output Summary (Last 24 hours) at 12/17/16 1316 Last data filed at 12/17/16 1136  Gross per 24 hour  Intake  925.24 ml  Output             3050 ml  Net         -2124.76 ml    Exam Gen:- AwakeComfortable, no acute distress,  HEENT:- Richard Morgan, moist mucous membranes  Neck-Supple Neck,No JVD,  Lungs- K to auscultation bilaterally  CV- S1, S2 normal Abd-  +ve B.Sounds, Abd Soft, No tenderness,    Extremity/Skin:- Intact peripheral  pulses  CNS alert and oriented 3   Data Review   CBC w Diff:  Lab Results  Component Value Date   WBC 6.1 12/16/2016   HGB 13.0 12/16/2016   HCT 36.3 (L) 12/16/2016   PLT 196 12/16/2016    CMP:  Lab Results  Component Value Date   NA 136 12/17/2016   K 2.9 (L) 12/17/2016   CL 103 12/17/2016   CO2 25 12/17/2016   BUN <5 (L) 12/17/2016   CREATININE 0.51 (L) 12/17/2016   PROT 6.3 (L) 12/16/2016   ALBUMIN 3.1 (L) 12/16/2016   BILITOT 0.7 12/16/2016   ALKPHOS 59 12/16/2016   AST 91 (H) 12/16/2016   ALT 56 12/16/2016  .   Total Discharge time is about 33 minutes  OSEI-BONSU,Jabier Deese M.D on 12/17/2016 at 1:16 PM  [facility]   Office  [phone]  Dragon dictation system was used to create this note, attempts have been made to correct errors, however presence of uncorrected errors is not a reflection quality of care provided

## 2016-12-17 NOTE — Progress Notes (Signed)
Explained and discussed discharge instructions with pt and family. Patient aware to make appointment on Monday as office closed today. Prescriptions and letter to return to work Monday given to patient. Patient going home with wife and family.

## 2017-01-31 ENCOUNTER — Ambulatory Visit: Payer: Self-pay | Attending: Internal Medicine | Admitting: Physician Assistant

## 2017-01-31 ENCOUNTER — Encounter: Payer: Self-pay | Admitting: Physician Assistant

## 2017-01-31 VITALS — BP 123/78 | HR 72 | Temp 98.2°F | Resp 18 | Ht 65.0 in | Wt 162.0 lb

## 2017-01-31 DIAGNOSIS — M6282 Rhabdomyolysis: Secondary | ICD-10-CM | POA: Insufficient documentation

## 2017-01-31 DIAGNOSIS — R945 Abnormal results of liver function studies: Secondary | ICD-10-CM

## 2017-01-31 DIAGNOSIS — E871 Hypo-osmolality and hyponatremia: Secondary | ICD-10-CM

## 2017-01-31 DIAGNOSIS — R7989 Other specified abnormal findings of blood chemistry: Secondary | ICD-10-CM

## 2017-01-31 DIAGNOSIS — E1110 Type 2 diabetes mellitus with ketoacidosis without coma: Secondary | ICD-10-CM

## 2017-01-31 DIAGNOSIS — E860 Dehydration: Secondary | ICD-10-CM | POA: Insufficient documentation

## 2017-01-31 DIAGNOSIS — Z79899 Other long term (current) drug therapy: Secondary | ICD-10-CM | POA: Insufficient documentation

## 2017-01-31 DIAGNOSIS — Z7984 Long term (current) use of oral hypoglycemic drugs: Secondary | ICD-10-CM | POA: Insufficient documentation

## 2017-01-31 DIAGNOSIS — R748 Abnormal levels of other serum enzymes: Secondary | ICD-10-CM

## 2017-01-31 DIAGNOSIS — F10239 Alcohol dependence with withdrawal, unspecified: Secondary | ICD-10-CM

## 2017-01-31 DIAGNOSIS — F10939 Alcohol use, unspecified with withdrawal, unspecified: Secondary | ICD-10-CM

## 2017-01-31 LAB — GLUCOSE, POCT (MANUAL RESULT ENTRY)
POC GLUCOSE: 337 mg/dL — AB (ref 70–99)
POC Glucose: 331 mg/dl — AB (ref 70–99)

## 2017-01-31 MED ORDER — GLUCOSE BLOOD VI STRP: ORAL_STRIP | 12 refills | Status: DC

## 2017-01-31 MED ORDER — INSULIN ASPART 100 UNIT/ML ~~LOC~~ SOLN
20.0000 [IU] | Freq: Once | SUBCUTANEOUS | Status: AC
  Administered 2017-01-31: 20 [IU] via SUBCUTANEOUS

## 2017-01-31 MED ORDER — TRUE METRIX METER W/DEVICE KIT: 1.0000 | PACK | Freq: Three times a day (TID) | 0 refills | Status: DC

## 2017-01-31 MED ORDER — LANCETS 30G MISC: 1.0000 | Freq: Three times a day (TID) | 12 refills | Status: DC

## 2017-01-31 NOTE — Patient Instructions (Addendum)
Check blood sugars fasting in the morning and record and bring to next visit.  La diabetes mellitus y los alimentos (Diabetes Mellitus and Food) Es importante que controle su nivel de azcar en la sangre (glucosa). El nivel de glucosa en sangre depende en gran medida de lo que usted come. Comer alimentos saludables en las cantidades [place] a lo largo del Futures trader, aproximadamente a la misma hora [employer], lo ayudar a Chief Operating Officer su nivel de Event organiser. Tambin puede ayudarlo a retrasar o Fish farm manager de la diabetes mellitus. Comer de [employer] saludable incluso puede ayudarlo a Event organiser de presin arterial y a Barista o Pharmacologist un peso saludable. Entre las recomendaciones generales para alimentarse y Water quality scientist los alimentos de forma saludable, se incluyen las siguientes:  Respetar las comidas principales y comer colaciones con regularidad. Evitar pasar largos perodos sin comer con el fin de perder peso.  Seguir una dieta que consista principalmente en alimentos de origen vegetal, como frutas, vegetales, frutos secos, legumbres y cereales integrales.  Utilizar mtodos de coccin a baja temperatura, como hornear, en lugar de mtodos de coccin a alta temperatura, como frer en abundante aceite. Trabaje con el nutricionista para aprender a Acupuncturist nutricional de las etiquetas de los alimentos. CMO PUEDEN AFECTARME LOS ALIMENTOS? Carbohidratos Los carbohidratos afectan el nivel de glucosa en sangre ms que cualquier otro tipo de alimento. El nutricionista lo ayudar a Chief Strategy Officer cuntos carbohidratos puede consumir en cada comida y ensearle a contarlos. El recuento de carbohidratos es importante para mantener la glucosa en sangre en un nivel saludable, en especial si utiliza insulina o toma determinados medicamentos para la diabetes mellitus. Alcohol El alcohol puede provocar disminuciones sbitas de la glucosa en sangre (hipoglucemia), en especial si utiliza  insulina o toma determinados medicamentos para la diabetes mellitus. La hipoglucemia es una afeccin que puede poner en peligro la vida. Los sntomas de la hipoglucemia (somnolencia, mareos y Administrator) son similares a los sntomas de haber consumido mucho alcohol. Si el mdico lo autoriza a beber alcohol, hgalo con moderacin y siga estas pautas:  Las mujeres no deben beber ms de un trago por da, y los hombres no deben beber ms de dos tragos por Futures trader. Un trago es igual a: ? 12 onzas (355 ml) de cerveza ? 5 onzas de vino (150 ml) de vino ? 1,5onzas (45ml) de bebidas espirituosas  No beba con el estmago vaco.  Mantngase hidratado. Beba agua, gaseosas dietticas o t helado sin azcar.  Las gaseosas comunes, los jugos y otros refrescos podran contener muchos carbohidratos y se Heritage manager. QU ALIMENTOS NO SE RECOMIENDAN? Cuando haga las elecciones de alimentos, es importante que recuerde que todos los alimentos son distintos. Algunos tienen menos nutrientes que otros por porcin, aunque podran tener la misma cantidad de caloras o carbohidratos. Es difcil darle al cuerpo lo que necesita cuando consume alimentos con menos nutrientes. Estos son algunos ejemplos de alimentos que debera evitar ya que contienen muchas caloras y carbohidratos, pero pocos nutrientes:  Neurosurgeon trans (la mayora de los alimentos procesados incluyen grasas trans en la etiqueta de Informacin nutricional).  Gaseosas comunes.  Jugos.  Caramelos.  Dulces, como tortas, pasteles, rosquillas y [place].  Comidas fritas. QU ALIMENTOS PUEDO COMER? Consuma alimentos ricos en nutrientes, que nutrirn el cuerpo y lo mantendrn saludable. Los alimentos que debe comer tambin dependern de varios factores, como:  Las caloras que necesita.  Los medicamentos que toma.  Su peso.  El nivel de glucosa  en sangre.  El [place] de presin arterial.  El nivel de colesterol. Debe consumir una amplia  variedad de alimentos, por ejemplo:  Protenas. ? Cortes de [employer] Corporation. ? Protenas con bajo contenido de grasas saturadas, como pescado, clara de huevo y frijoles. Evite las carnes procesadas.  Frutas y vegetales. ? [place] y Sports administrator que pueden ayudar a [employer] niveles sanguneos de [place], como [place], mangos y batatas.  Productos lcteos. ? Elija productos lcteos sin grasa o con bajo contenido de [place], como [place], yogur y [place].  Cereales, panes, pastas y arroz. ? Elija cereales integrales, como panes multicereales, avena en grano y arroz integral. Estos alimentos pueden ayudar a controlar la presin arterial.  Rosalin Hawking. ? Alimentos que contengan grasas saludables, como frutos secos, Chartered certified accountant, aceite de [place], aceite de canola y pescado. TODOS LOS QUE PADECEN DIABETES MELLITUS TIENEN EL MISMO PLAN DE COMIDAS? Dado que todas las personas que padecen diabetes mellitus son distintas, no hay un solo plan de comidas que funcione para todos. Es muy importante que se rena con un nutricionista que lo ayudar a crear un plan de comidas adecuado para usted. Esta informacin no tiene Theme park manager el consejo del mdico. Asegrese de hacerle al mdico cualquier pregunta que tenga. Document Released: 08/08/2007 Document Revised: 05/22/2014 Document Reviewed: 03/28/2013 Elsevier Interactive Patient Education  2017 Elsevier Inc.  Trastorno por abuso de alcohol (Alcohol Use Disorder) El trastorno por abuso de alcohol es un trastorno mental. No se trata de un incidente nico en el que se bebe en abundancia. Este trastorno se debe al consumo incontrolable del alcohol a lo largo del [place], que causa dificultades en una o ms reas de la vida diaria. Al consumir en exceso, las personas que sufren este trastorno estn en riesgo de daarse a s mismos y a Dentist. Tambin puede causar otros trastornos [place], como trastornos del [place] de nimo y trastornos de [place], y problemas  fsicos graves. Las personas que sufren trastornos por consumo de alcohol, generalmente abusan de otras sustancias.  Los trastornos por consumo de alcohol son frecuentes y se han generalizado. Algunas personas beben alcohol para poder enfrentarse o escapar a las situaciones negativas de la vida. Otros beben para Government social research officer crnico o los sntomas de una enfermedad mental. Las personas con historia familiar de trastornos por consumo de alcohol tienen ms riesgo de perder el control y de consumir alcohol en exceso.  Beber mucho alcohol puede ser causa de traumatismos, accidentes y [address] de [place]. Un trago puede ser demasiado si:  Problemas laborales.  Est embarazada o amamantando.  Est tomando medicamentos. Consulte a su mdico.  Est conduciendo o va a hacerlo. SNTOMAS  Los signos y sntomas pueden ser:   Consumo dealcohol engrandes cantidades o durante perodos ms prolongados que lo previsto.  Mltiples intentos fallidos de dejar de bebero controlar el consumo de alcohol.   Emplear gran cantidad de tiempo para obtener, consumir o recuperarse de los efectos del alcohol (resaca).  Un deseo intenso o necesidad de consumir alcohol (abstinencia).   Continuar consumiendo alcohol a pesar de los [employer] Oil, en la escuela o en el hogar debido al consumo.   Continuar consumiendo a Scientist, water quality de American Standard Companies de relacin debido al consumo.  Continuar consumiendo an en situaciones que impliquen un peligro fsico, como al conducir un vehculo.  Continuar consumiendo a pesar de saber que un problema fsico o psicolgico probablemente se relaciones con el consumo de alcohol. Los problemas fsicos relacionados con  el consumo de alcohol pueden producirse en el cerebro, el corazn, el hgado, el estmago y los intestinos. Los problemas psicolgicos relacionados con el consumo de alcohol incluyen intoxicacin, depresin, ansiedad, psicosis, delirio y Photographer.   La necesidad  de aumentar la cantidad de alcohol para Wellsite geologist deseado, o una disminucin del efecto al consumir la misma cantidad de alcohol (tolerancia).  Sndrome de abstinencia al reducir o Photographer consumo, o tener que consumir alcohol para reducir o Multimedia programmer sndrome de abstinencia. Los sntomas de la abstinencia incluyen: ? Frecuencia cardaca acelerada. ? Temblores en las manos. ? Dificultad para dormir. ? Nuseas. ? Vmitos. ? Alucinaciones. ? Agitacin. ? Convulsiones. DIAGNSTICO  El mdico es el encargado de diagnosticar el trastorno por consumo de alcohol. Comenzar hacindole tres o cuatro preguntas para evaluar si consume alcohol en forma excesiva o si representa un problema. Para confirmar el diagnstico de trastorno por consumo de alcohol, deben estar presentes al [employer] un perodo de 12 meses. La gravedad del trastorno depende del nmero de sntomas:   Leve-dos o tres.  Moderado-cuatro o cinco.  Grave-seis o ms. El mdico podr Sports coach un examen fsico o [employer] Scientific los [place] de las pruebas de laboratorio para ver si usted tiene problemas fsicos como resultado del consumo de alcohol. Podr derivarlo a Administrator, sports de la salud mental para que realice una evaluacin.  TRATAMIENTO   Algunas personas podrn reducir el consumo a niveles en los que haya un bajo riesgo. Otras personas deben dejar de beber. Cuando sea necesario, podr recibir ayuda de los profesionales de la salud mental, capacitados en el tratamiento del abuso de sustancias. El mdico podr informarlo sobre la gravedad de su trastorno por consumo de alcohol y decidir qu tipo de tratamiento necesita. Las formas de tratamiento disponibles son:   Desintoxicacin. La desintoxicacin consiste en el uso de medicamentos recetados para evitar el sndrome de abstinencia durante la primera semana en la que se deja de consumir. Esto es importante para las personas con historia de sndrome de  abstinencia y para los que consumen en exceso, quienes probablemente sufran el sndrome de abstinencia. El sndrome de abstinencia puede ser peligroso y en algunos casos puede causar la [place]. La desintoxicacin generalmente se realiza internado en un hospital o en una institucin para pacientes que abusan de sustancias.  Asesoramiento psicolgico o psicoterapia. La psicoterapia la realizan terapeutas especializados en el tratamiento de pacientes que abusan de sustancias. Se evalan los motivos que llevan a consumir alcohol, y los modos para Furniture conservator/restorer a consumir. Los PepsiCo de la psicoterapia son ayudar a que las personas con trastornos por consumo de alcohol encuentren actividades saludables y Standard Pacific de Radio broadcast assistant estrs de la vida, a identificar y Programmer, applications disparadores que originan el consumo de alcohol y a Scientist, physiological abstinencia, que puede originar una recada.  Medicamentos.Hay diferentes medicamentos que pueden ayudar a tratar el trastorno por consumo de alcohol a travs de los siguientes efectos: ? Controlan la ansiedad por consumir. ? Disminuyen la respuesta de recompensa positiva que se siente al consumir. ? Causan una reaccin fsica desagradable cuando se consume alcohol (terapia por aversin).  Grupos de apoyo. Los grupos de apoyo son dirigidos por personas que han dejado de consumir. Ellos brindan apoyo emocional, consejo y [place]. Estas formas de tratamiento generalmente se combinan. Algunas personas se benefician con el tratamiento combinado intensivo que se ofrece en algunos centros de tratamiento especializados en el abuso de sustancias. Existen programas para pacientes hospitalizados  o ambulatorios.  Esta informacin no tiene Theme park manager el consejo del mdico. Asegrese de hacerle al mdico cualquier pregunta que tenga. Document Released: 05/01/2005 Document Revised: 08/23/2015 Elsevier Interactive Patient Education  2017 [employer].

## 2017-01-31 NOTE — Progress Notes (Signed)
Patient ID: [facility], male   DOB: 1975/06/30, 41 y.o.   MRN: [id number]    [facility], [facility], is a 41 y.o. male  OZH:[id number]  GEX:[id number]  DOB - 1975/08/25  Subjective:  Chief Complaint and HPI: Richard Morgan is a 41 y.o. male here today to establish care and for a follow up visit  After being hospitalized 12/14/2016-12/17/2016 after drinking isopropyl alcohol because he ran out of regular alcohol.    He presents today feeling back to normal.  Denies any complaints.  He was diagnosed with diabetes while hospitalized and found to have an A1C of 12.8.  He was started on metformin and is tolerating it well.  He does not have a glucometer.  He has not drank alcohol since hospitalization.  He has never been to AA/12step recovery/treatment.  He has stopped drinking before on his own and stayed sober for about 1 year before drinking again.  [employer] interpreters "Richard Morgan" translating  From Hospital notes: Admission Diagnosis  Hyponatremia [E87.1] Elevated LFTs [R79.89] Accidental poisoning by isopropyl alcohol, initial encounter [T51.2X1A]  From hospital d/c: HPI: Richard Bailon Romanis a 40 y.o.malewith medical history significant of Alcohol abuse and DM   Presented with 4 day history of nausea vomiting abdominal pain and epigastric region, vomitus significant for coffee-ground emesis. Patient drinks an regular basis about 6 beers per day. Sunday family noted that he drank a whole bottle of isopropyl alcohol because he couldn't get to regular alcohol. Patient states he did not notice make him sick. He was noted to be severely confused thereafter. Patient had not had alcohol since Monday for past 3 days. Patient is no primary care provider and unaware about any past medical history. He denies history of seizures or DTs in the past but does reports tremors.  He states 3 years ago he was told that he was diabetic but never followed up.   #2 Severe Hyponatremia: 119 on  admission Suspectbeer potomania. 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    ROS:   Constitutional:  No f/c, No night sweats, No unexplained weight loss. EENT:  No vision changes, No blurry vision, No hearing changes. No mouth, throat, or ear problems.  Respiratory: No cough, No SOB Cardiac: No CP, no palpitations GI:  No abd pain, No N/V/D. GU: No Urinary s/sx Musculoskeletal: No joint pain Neuro: No headache, no dizziness, no motor weakness.  Skin: No rash Endocrine:  No polydipsia. No polyuria.  Psych: Denies SI/HI  No problems updated.  ALLERGIES: No Known Allergies  PAST MEDICAL HISTORY: Past Medical History:  Diagnosis Date  . Diabetes mellitus without complication (HCC)     MEDICATIONS AT HOME: Prior to Admission medications   Medication Sig Start Date End Date Taking? Authorizing Provider  calcium carbonate (TUMS - DOSED IN MG  ELEMENTAL CALCIUM) 500 MG chewable tablet Chew 1-2 tablets by mouth 3 (three) times daily as needed for indigestion or heartburn.    [provider]  folic acid (FOLVITE) 1 MG tablet Take 1 tablet (1 mg total) by  mouth daily. 12/17/16 12/17/17  Jackie Plum, MD  metFORMIN (GLUCOPHAGE) 850 MG tablet Take 1 tablet (850 mg total) by mouth 2 (two) times daily with a meal. 12/17/16 12/17/17  Osei-Bonsu, Greggory Stallion, MD  pantoprazole (PROTONIX) 40 MG tablet Take 1 tablet (40 mg total) by mouth 2 (two) times daily. 12/17/16 12/17/17  Jackie Plum, MD  phosphorus (K PHOS NEUTRAL) [phone] MG tablet Take 2 tablets (500 mg total) by mouth 2 (two) times daily. 12/17/16   Jackie Plum, MD  potassium chloride (K-DUR) 10 MEQ tablet Take 2 tablets (20 mEq total) by mouth daily. 12/17/16   Jackie Plum, MD  thiamine (VITAMIN B-1) 100 MG tablet Take 1 tablet (100 mg total) by mouth daily. 12/17/16   Jackie Plum, MD     Objective:  EXAM:   Vitals:   01/31/17 1525  BP: 123/78  Pulse: 72  Resp: 18  Temp: 98.2 F (36.8 C)  TempSrc: Oral  SpO2: 98%  Weight: 162 lb (73.5 kg)  Height:  (1.651 m)    General appearance : A&OX3. NAD. Non-toxic-appearing HEENT: Atraumatic and Normocephalic.  PERRLA. EOM intact.   Neck: supple, no JVD. No cervical lymphadenopathy. No thyromegaly Chest/Lungs:  Breathing-non-labored, Good air entry bilaterally, breath sounds normal without rales, rhonchi, or wheezing  CVS: S1 S2 regular, no murmurs, gallops, rubs  Extremities: Bilateral Lower Ext shows no edema, both legs are warm to touch with = pulse throughout Neurology:  CN II-XII grossly intact, Non focal.   Psych:  TP linear. J/I WNL. Normal speech. Appropriate eye contact and affect.  Skin:  No Rash  Data Review Lab Results  Component Value Date   HGBA1C 12.8 (H) 12/15/2016     Assessment & Plan   1. Diabetic ketoacidosis without coma associated with type 2 diabetes mellitus (HCC) Uncontrolled.   Just diagnosed and just started on metformin.  Glucometer ordered to our pharmacy.  Check blood sugar fasting and record and bring to next visit.   - Glucose (CBG) - Comprehensive metabolic panel - insulin  aspart (novoLOG) injection 20 Units; Inject 0.2 mLs (20 Units total) into the skin once.  2. Alcohol withdrawal syndrome with complication, with unspecified complication (HCC) AA recommended.  Meeting schedule given. I have counseled the patient at length about substance abuse and addiction.  12 step meetings/recovery recommended.  Local 12 step meeting lists were given and attendance was encouraged.  Patient expresses understanding.  - Phosphorus - Magnesium  3. Hyponatremia - Comprehensive metabolic panel  4. Elevated LFTs - Comprehensive metabolic panel  5. Elevated CK/Rhabdomyolysis-no symptoms now - CKMB  Patient have been counseled extensively about nutrition and exercise  Return in about 3 weeks (around 02/21/2017) for assign PCP; for DM and abnormal labs.  The patient was given clear instructions to go to ER or return to medical center if symptoms don't improve, worsen or new problems develop. The patient verbalized understanding. The patient was told to call to get lab results if they haven't heard anything in the next week.     Georgian Co, PA-C [facility] [place], [place] [phone]   01/31/2017, 3:48 PM

## 2017-02-01 LAB — COMPREHENSIVE METABOLIC PANEL
ALBUMIN: 4.6 g/dL (ref 3.5–5.5)
ALK PHOS: 85 IU/L (ref 39–117)
ALT: 19 IU/L (ref 0–44)
AST: 19 IU/L (ref 0–40)
Albumin/Globulin Ratio: 1.6 (ref 1.2–2.2)
BUN/Creatinine Ratio: 11 (ref 9–20)
BUN: 8 mg/dL (ref 6–20)
Bilirubin Total: 0.4 mg/dL (ref 0.0–1.2)
CHLORIDE: 98 mmol/L (ref 96–106)
CO2: 23 mmol/L (ref 20–29)
Calcium: 9.7 mg/dL (ref 8.7–10.2)
Creatinine, Ser: 0.71 mg/dL — ABNORMAL LOW (ref 0.76–1.27)
GFR calc Af Amer: 138 mL/min/{1.73_m2} (ref >59)
GFR calc non Af Amer: 119 mL/min/{1.73_m2} (ref >59)
GLOBULIN, TOTAL: 2.9 g/dL (ref 1.5–4.5)
GLUCOSE: 317 mg/dL — AB (ref 65–99)
POTASSIUM: 4.6 mmol/L (ref 3.5–5.2)
Sodium: 137 mmol/L (ref 134–144)
TOTAL PROTEIN: 7.5 g/dL (ref 6.0–8.5)

## 2017-02-01 LAB — CREATININE KINASE MB

## 2017-02-01 LAB — PHOSPHORUS: Phosphorus: 3.6 mg/dL (ref 2.5–4.5)

## 2017-02-01 LAB — MAGNESIUM: MAGNESIUM: 1.9 mg/dL (ref 1.6–2.3)

## 2017-02-02 ENCOUNTER — Telehealth: Payer: Self-pay

## 2017-02-02 NOTE — Progress Notes (Signed)
Please inform patient of his levels returning back to normal. PATIENT SHOULD NOT DRINK AT ALL. Please limit carb and sugar intake and taken medications as prescribed. Also check sugar levels and follow up as planned.

## 2017-02-02 NOTE — Telephone Encounter (Signed)
CMA call regarding lab results   Patient wife verify DOB   Patient wife was aware and understood the results at the moment she did not had any questions

## 2017-02-02 NOTE — Telephone Encounter (Signed)
-----   Message from Margaretmary Lombard, [place] sent at 02/02/2017 10:43 AM EDT ----- Please inform patient of his levels returning back to normal. PATIENT SHOULD NOT DRINK AT ALL. Please limit carb and sugar intake and taken medications as prescribed. Also check sugar levels and follow up as planned.

## 2017-02-14 ENCOUNTER — Ambulatory Visit: Payer: Self-pay

## 2017-03-15 ENCOUNTER — Encounter: Payer: Self-pay | Admitting: Family Medicine

## 2017-03-15 ENCOUNTER — Ambulatory Visit: Payer: Self-pay | Attending: Family Medicine | Admitting: Family Medicine

## 2017-03-15 VITALS — BP 129/88 | HR 79 | Temp 99.2°F | Resp 18 | Ht 65.0 in | Wt 165.2 lb

## 2017-03-15 DIAGNOSIS — Z794 Long term (current) use of insulin: Secondary | ICD-10-CM | POA: Insufficient documentation

## 2017-03-15 DIAGNOSIS — E1110 Type 2 diabetes mellitus with ketoacidosis without coma: Secondary | ICD-10-CM

## 2017-03-15 DIAGNOSIS — R109 Unspecified abdominal pain: Secondary | ICD-10-CM | POA: Insufficient documentation

## 2017-03-15 DIAGNOSIS — Z79899 Other long term (current) drug therapy: Secondary | ICD-10-CM | POA: Insufficient documentation

## 2017-03-15 DIAGNOSIS — F1010 Alcohol abuse, uncomplicated: Secondary | ICD-10-CM | POA: Insufficient documentation

## 2017-03-15 DIAGNOSIS — H11001 Unspecified pterygium of right eye: Secondary | ICD-10-CM

## 2017-03-15 LAB — POCT GLYCOSYLATED HEMOGLOBIN (HGB A1C): HEMOGLOBIN A1C: 12.3

## 2017-03-15 LAB — POCT URINALYSIS DIPSTICK
BILIRUBIN UA: NEGATIVE
Bilirubin, UA: NEGATIVE
Blood, UA: NEGATIVE
Glucose, UA: 500
Glucose, UA: 500
KETONES UA: 15
Ketones, UA: 15
LEUKOCYTES UA: NEGATIVE
Leukocytes, UA: NEGATIVE
Nitrite, UA: NEGATIVE
Nitrite, UA: NEGATIVE
PH UA: 6 (ref 5.0–8.0)
PH UA: 6.5 (ref 5.0–8.0)
Protein, UA: NEGATIVE
Protein, UA: NEGATIVE
Spec Grav, UA: <=1.005 — AB (ref 1.010–1.025)
UROBILINOGEN UA: 1 U/dL
Urobilinogen, UA: 1 E.U./dL

## 2017-03-15 LAB — GLUCOSE, POCT (MANUAL RESULT ENTRY)
POC GLUCOSE: 386 mg/dL — AB (ref 70–99)
POC Glucose: 429 mg/dl — AB (ref 70–99)

## 2017-03-15 MED ORDER — INSULIN ASPART 100 UNIT/ML ~~LOC~~ SOLN: 20.0000 [IU] | Freq: Once | SUBCUTANEOUS | Status: DC

## 2017-03-15 MED ORDER — INSULIN DETEMIR 100 UNIT/ML FLEXPEN: 10.0000 [IU] | PEN_INJECTOR | Freq: Every day | SUBCUTANEOUS | 11 refills | Status: DC

## 2017-03-15 MED ORDER — METFORMIN HCL 1000 MG PO TABS: 1000.0000 mg | ORAL_TABLET | Freq: Two times a day (BID) | ORAL | 11 refills | Status: DC

## 2017-03-15 MED ORDER — PEN NEEDLES 32G X 4 MM MISC: 1.0000 | Freq: Once | 11 refills | Status: AC

## 2017-03-15 NOTE — Progress Notes (Signed)
Subjective:  Patient ID: [facility], male    DOB: 1976/04/27  Age: 41 y.o. MRN: [id number]  CC: Diabetes   HPI [facility] presents to establish care. PMH DM type 2 ad ETOH abuse.  Symptoms: none.Patient denies foot ulcerations, hyperglycemia, nausea, paresthesia of the feet, visual disturbances, vomitting and weight loss.  Evaluation to date has been included: fasting blood sugar and hemoglobin A1C.  Home sugars: QOD. He does note bring his glucometer or glucose log with him. Treatment to date: metformin. He reports history of abdominal pain about 3 weeks ago.  Denies any N/V/D, clay color stools, bloody stools, or jaundice. Pain not worsened by foods.He denies any abdominal pain currently.  He reports drinking 6 beers per day. He declines speaking with LCSW or counseling resources at this time. He reports having information for  alcoholism resources.     Outpatient Medications Prior to Visit  Medication Sig Dispense Refill  . Blood Glucose Monitoring Suppl (TRUE METRIX METER) w/Device KIT 1 each by Does not apply route 3 (three) times daily. 1 kit 0  . calcium carbonate (TUMS - DOSED IN MG ELEMENTAL CALCIUM) 500 MG chewable tablet Chew 1-2 tablets by mouth 3 (three) times daily as needed for indigestion or heartburn.    . folic acid (FOLVITE) 1 MG tablet Take 1 tablet (1 mg total) by mouth daily. 30 tablet 0  . glucose blood (TRUE METRIX BLOOD GLUCOSE TEST) test strip Use as instructed 100 each 12  . Lancets 30G MISC 1 each by Does not apply route 3 (three) times daily. 100 each 12  . pantoprazole (PROTONIX) 40 MG tablet Take 1 tablet (40 mg total) by mouth 2 (two) times daily. 60 tablet 1  . phosphorus (K PHOS NEUTRAL) [phone] MG tablet Take 2 tablets (500 mg total) by mouth 2 (two) times daily. 10 tablet 0  . potassium chloride (K-DUR) 10 MEQ tablet Take 2 tablets (20 mEq total) by mouth daily. 10 tablet 0  . thiamine (VITAMIN B-1) 100 MG tablet Take 1 tablet (100  mg total) by mouth daily. 30 tablet 0  . metFORMIN (GLUCOPHAGE) 850 MG tablet Take 1 tablet (850 mg total) by mouth 2 (two) times daily with a meal. 60 tablet 11   No facility-administered medications prior to visit.     ROS Review of Systems  Constitutional: Negative.   Eyes: Negative.   Respiratory: Negative.   Cardiovascular: Negative.   Gastrointestinal: Negative.   Skin: Negative.   Neurological: Negative.   Psychiatric/Behavioral: Negative for suicidal ideas.       History of alcoholism    Objective:  BP 129/88 (BP Location: Left Arm, Patient Position: Sitting, Cuff Size: Normal)   Pulse 79   Temp 99.2 F (37.3 C) (Oral)   Resp 18   Ht _0  (1.651 m)   Wt 165 lb 3.2 oz (74.9 kg)   SpO2 98%   BMI 27.49 kg/m   BP/Weight 03/15/2017 6/59/9357 0/05/7791  Systolic BP 903 009 233  Diastolic BP 88 78 99  Wt. (Lbs) 165.2 162 -  BMI 27.49 26.96 -     Physical Exam  Constitutional: He is oriented to person, place, and time. He appears well-developed and well-nourished.  HENT:  Head: Normocephalic and atraumatic.  Right Ear: External ear normal.  Left Ear: External ear normal.  Nose: Nose normal.  Mouth/Throat: Oropharynx is clear and moist.  Eyes: Conjunctivae are normal. Pupils are equal, round, and reactive to light.  Neck: Normal  range of motion. Neck supple.  Cardiovascular: Normal rate, regular rhythm, normal heart sounds and intact distal pulses.  Pulmonary/Chest: Effort normal and breath sounds normal.  Abdominal: Soft. Bowel sounds are normal. There is no tenderness.  Neurological: He is alert and oriented to person, place, and time.  Skin: Skin is warm and dry.  Psychiatric: He expresses no homicidal and no suicidal ideation. He expresses no suicidal plans and no homicidal plans.  Nursing note and vitals reviewed.  Diabetic Foot Exam - Simple   Simple Foot Form Diabetic Foot exam was performed with the following findings:  Yes 03/15/2017  3:13 AM  Visual  Inspection No deformities, no ulcerations, no other skin breakdown bilaterally:  Yes Sensation Testing Intact to touch and monofilament testing bilaterally:  Yes Pulse Check Posterior Tibialis and Dorsalis pulse intact bilaterally:  Yes Comments       Assessment & Plan:   1. Diabetic ketoacidosis without coma associated with type 2 diabetes mellitus (HCC) Insulin added.  Follow up with clinical pharmacist in 2 weeks. Recommend recheck in 3 months with PCP. Dieta and exercise discussed. - Glucose (CBG) - HgB A1c - insulin aspart (novoLOG) injection 20 Units - Urinalysis Dipstick - Lipid Panel - Insulin Detemir (LEVEMIR FLEXPEN) 100 UNIT/ML Pen; Inject 10 Units into the skin daily at 10 pm.  Dispense: 15 mL; Refill: 11 - Insulin Pen Needle (PEN NEEDLES) 32G X 4 MM MISC; 1 kit by Does not apply route once.  Dispense: 200 each; Refill: 11 - metFORMIN (GLUCOPHAGE) 1000 MG tablet; Take 1 tablet (1,000 mg total) by mouth 2 (two) times daily with a meal.  Dispense: 60 tablet; Refill: 11 - Ambulatory referral to Ophthalmology - Glucose (CBG) - Urinalysis Dipstick  2. Pterygium of right eye Discussed importance of protective eyewear with sun-exposure - Ambulatory referral to Ophthalmology     Follow-up: Return in about 2 weeks (around 03/29/2017) for DM with [facility].   Alfonse Spruce FNP

## 2017-03-15 NOTE — Patient Instructions (Signed)
Check blood sugars three times a day. Bring blood sugar log or glucometer to next office visit.  Cmo y dnde aplicar inyecciones de insulina por va subcutnea - adultos (How and Where to Give Subcutaneous Insulin Injections, Adult) Las personas con diabetes de tipo 1 deben administrarse insulina debido a que sus cuerpos no la producen. Las personas con diabetes tipo 2 pueden requerir insulina. Existen muchos tipos diferentes de [place], as como tambin otros medicamentos inyectables para la diabetes, que se deben Energy manager la capa de tejido graso que se encuentra debajo de la piel. El tipo de insulina o el medicamento inyectable para la diabetes que tome puede determinar cuntas inyecciones deber administrarse y cundo deben aplicarse. ELEGIR UNA ZONA PARA LA INYECCIN: La absorcin de insulina vara de un sitio a otro. Al igual que con cualquier medicamento inyectable, se recomienda inyectar la insulina dentro de la misma regin del cuerpo. Sin embargo, la insulina no debe inyectarse en la misma zona cada vez que se administre. Rotar las zonas para las Investment banker, corporate la inflamacin o la degradacin de los tejidos. Hay cuatro regiones principales que pueden utilizarse para las inyecciones. Las regiones incluyen:  Abdomen (regin de preferencia, especialmente para medicamentos inyectables para la diabetes diferentes de la insulina).  La parte anterior y superior externa de los muslos.  La parte posterior superior del brazo  Los glteos UTILIZACIN DE LA [place] AMPOLLA Administrar insulina: nica dosis 1. Lave sus manos con agua y [place]. 2. Haga rodar suavemente el frasco de insulina (ampolla) entre sus manos para [place]. No agite la ampolla. 3. Limpie el tapn de goma de la ampolla con un hisopo con alcohol. Asegrese de eliminar la parte plstica superior de las ampollas nuevas. 4. Retire la cubierta plstica de la aguja que est en la [place]. No deje que la aguja toque  nada. 5. Tire del mbolo para extraer el aire de la [place]. Debe tener la misma cantidad de aire que de dosis de [place]. 6. Inserte la aguja a travs de la tapa de goma de la ampolla. No d vuelta la ampolla. 7. Empuje todo el mbolo para pasar el aire a la ampolla. 8. Deje la aguja en la ampolla. Luego, d vuelta la ampolla y la [place]. 9. Tire lentamente del mbolo, para que ingrese la cantidad de insulina que necesita a Research scientist (life sciences). 10. Observe si quedaron burbujas de [employer]. Podr necesitar empujar el mbolo hacia arriba y [place] abajo 2 o 3 veces para eliminar las burbujas de aire de la [place]. 11. Tire nuevamente del mbolo para conseguir la dosis correcta. 12. Quite la aguja de la ampolla. 13. Utilice una toallita impregnada en alcohol para limpiar la zona en la que se inyectar. 14. Inyctela, aproximadamente, 1 pulgada (2,5 cm) por debajo de la superficie de la piel, y [place]. 15. Coloque la aguja derecha en la piel (en un ngulo de 90 grados). Inserte la aguja en toda su extensin (hasta la unin con la [place]). En adultos de talla pequea y que tienen poca grasa, deber inyectarla en un ngulo de 45 grados. 16. Cuando la aguja est insertada, puede soltar la piel. 17. Empuje el mbolo hasta el final para Community education officer. 18. Para extraer, tire la aguja en forma recta. 19. Presione la toallita embebida en alcohol sobre el sitio en que aplic la inyeccin. Sostngalo all por algunos segundos. No frote la zona. 20. No vuelva a colocar la cubierta plstica en la aguja. Administrar insulina:  mezcla de 2 tipos de insulina 1. Lave sus manos con agua y [place]. 2. Haga rodar suavemente el frasco (ampolla) de la insulina "turbia" entre sus manos o grelo hacia abajo para [place]ar[place]. 3. Limpie la parte superior de la ampolla con un hisopo con alcohol. Asegrese de eliminar la tapa plstica superior de las ampollas nuevas. 4. Asegrese de que entre a la jeringa la misma  cantidad de aire como de Guaminsulina "turbia" que necesita. 5. Coloque la aguja de la [place]a en el envase de insulina "turbia" e inyecte aire. Asegrese de que la ampolla est [place] arriba. 6. Quite la aguja de la ampolla de Guaminsulina "turbia". 7. Tire de la [place] para que entre tanto aire como la cantidad de insulina "cristalina" que necesita. 8. Coloque la aguja de la Pacific Mutualjeringa en el envase de insulina "cristalina" e inyecte aire. 9. Deje la aguja en la ampolla de insulina "cristalina" y luego pngala boca abajo. 10. Tire lentamente del mbolo para que la cantidad de insulina "cristalina" deseada ingrese a la [place]. 11. Observe si quedaron burbujas de E. I. du Pontaire en la jeringa. Podr necesitar empujar el mbolo hacia arriba y [place]hacia abajo 2 o 3 veces para eliminar las burbujas de aire de la [place]. 12. Quite la aguja de la ampolla de insulina "cristalina". 13. Coloque la aguja en la ampolla de insulina "turbia". No inyecte la insulina "cristalina" en el frasco de la "turbia". 14. Coloque la ampolla de la insulina "turbia" [place] Sharpshacia abajo y tire del mbolo hasta que quede la misma cantidad total de insulina "cristalina" e insulina "turbia". 15. Quite la aguja de la ampolla de Guaminsulina "turbia". 16. Utilice una toallita impregnada en alcohol para limpiar la zona en la que se inyectar. 17. Coloque la aguja derecha en la piel (en un ngulo de 90 grados). Inserte la aguja en toda su extensin (hasta la unin con la [place]). En adultos de talla pequea y que tienen poca grasa, deber inyectarla en un ngulo de 45 grados. 18. Cuando la aguja est insertada, puede soltar la piel. 19. Empuje el mbolo hasta el final para Community education officerinyectar el medicamento. 20. Para extraer, tire la aguja en forma recta. 21. Presione la toallita embebida en alcohol sobre el sitio en que aplic la inyeccin. Sostngalo all por algunos segundos. No frote la zona. 22. No vuelva a colocar la cubierta plstica en la aguja. UTILIZACIN DE  LAPICERAS DE INSULINA 1. Lave sus manos con agua y [place]. 2. Si est utilizando insulina "turbia" haga rodar la lapicera entre sus manos varias veces o grela de arriba [place]hacia abajo. 3. Retire la tapa de la lapicera de insulina. 4. Limpie el tapn de goma del cartucho con una toallita impregnada en alcohol. 5. Quite la etiqueta protectora de la aguja desechable. 6. Enrosque la aguja en la lapicera. 7. Retire la cubierta plstica externa protectora de la aguja. 8. Retire la cubierta plstica interna protectora de la aguja. 9. Prepare la lapicera de insulina colocando el botn (dial) a 2 unidades. Sostenga la lapicera apuntando hacia arriba y empuje el dial hasta que aparezca una gota de insulina en la punta de la aguja. Si esto no ocurre, Paediatric nurserepita este paso. 10. Coloque en el dial el nmero de unidades de insulina que [place]letoninyectar. 11. Utilice una toallita impregnada en alcohol para limpiar la zona en la que se inyectar. 12. Inyctela, aproximadamente, 1 pulgada (2,5 cm) por debajo de la superficie de la piel, y [place]. 13. Coloque la aguja derecha en la piel (en un ngulo de  90 grados). 14. Empuje el botn (dial) hacia abajo para que la insulina ingrese en el tejido [place]. 15. Cuente hasta 10 lentamente. Luego quite la [place] del tejido Journalist, newspaper. 16. Coloque la cubierta plstica externa en la aguja y desenrsquela. COMO DESECHAR LOS ELEMENTOS  Deseche las jeringas usadas en un recipiente especial para objetos cortopunzantes. Siga las indicaciones de las normas de la zona en que usted vive.  Las [employer] y las lapiceras desechables pueden arrojarse en un cesto de basura comn. Esta informacin no tiene Theme park manager el consejo del mdico. Asegrese de hacerle al mdico cualquier pregunta que tenga. Document Released: 05/01/2005 Document Revised: 08/23/2015 Document Reviewed: 10/08/2012 Elsevier Interactive Patient Education  2018 [employer].    Control del nivel sanguneo de glucosa en  los adultos (Blood Glucose Monitoring, Adult) El control del nivel de azcar (glucosa) en la sangre lo ayuda a tener la diabetes bajo control. Tambin ayuda a que usted y el mdico controlen si el tratamiento de la diabetes es Engineer, manufacturing. El control del nivel sanguneo de glucosa implica realizar controles regulares como lo indique el mdico y Midwife registro de los resultados (registro diario). POR QU DEBO CONTROLAR EL NIVEL SANGUNEO DE GLUCOSA? Si controla su nivel sanguneo de glucosa con regularidad, podr:  Comprender de [employer], la actividad fsica, las enfermedades y los medicamentos inciden en los niveles sanguneos de [place].  Conocer el nivel sanguneo de glucosa en cualquier momento dado. Saber rpidamente si el nivel es bajo (hipoglucemia) o alto (hiperglucemia).  Puede ser de ayuda para que usted y el mdico sepan cmo ajustar los medicamentos. CUNDO DEBO CONTROLAR EL NIVEL SANGUNEO DE GLUCOSA? Siga las indicaciones del mdico acerca de la frecuencia con la que debe controlar el nivel sanguneo de glucosa. La frecuencia puede depender de:  El tipo de diabetes que tenga.  Si su diabetes est bajo control.  Los medicamentos que toma. Si usted tiene diabetes tipo 1:  Controle su nivel sanguneo de glucosa al [facility] al da.  Tambin controle su nivel sanguneo de glucosa: ? Antes de cada inyeccin de insulina. ? Antes y despus de hacer ejercicio. ? Entre las comidas. ? Dos horas despus de una comida. ? Ocasionalmente, entre las 2:00a.m. y las 3:00a.m., como se lo hayan indicado. ? Antes de Education officer, environmental tareas peligrosas, Sport and exercise psychologist o usar maquinaria pesada. ? A la hora de acostarse.  Es posible que Chemical engineer con ms frecuencia los niveles sanguneos de [place], [place] 6 a 10veces por da: ? Si [place] una bomba de [place]. ? Si necesita varias inyecciones diarias. ? Si su diabetes no est bien controlada. ? Si est enfermo. ? Si tiene  antecedentes de hipoglucemia grave. ? Si tiene antecedentes de no darse cuenta cundo est bajando su nivel sanguneo de glucosa (hipoglucemia asintomtica). Si usted tiene diabetes tipo 2:  Si recibe insulina u otro medicamento para la diabetes, controle el nivel sanguneo de glucosa al [employer] veces al da.  Mientras reciba tratamiento intensivo con insulina, debe medirse el nivel sanguneo de glucosa al menos 4veces al [employer]. Ocasionalmente, es posible que deba controlarse entre las 2:00a.m. y las 3:00a.m., segn se lo indiquen.  Tambin controle su nivel sanguneo de glucosa: ? Antes y despus de hacer ejercicio. ? Antes de Education officer, environmental tareas peligrosas, Sport and exercise psychologist o usar maquinaria pesada.  Es posible que Chemical engineer con ms frecuencia los niveles sanguneos de glucosa si: ? Es necesario ajustar la dosis de sus medicamentos. ? Su diabetes no  est bien controlada. ? Est enfermo. QU ES UN REGISTRO DIARIO DEL NIVEL [place] DE GLUCOSA?  Un registro diario es un registro de los valores de glucosa en la [place]. Puede ayudarles a usted y a su mdico a: ? Conservator, museum/gallery en su nivel sanguneo de glucosa durante el transcurso del [place]. ? Ajustar su plan de control de la diabetes como sea necesario.  Cada vez que controle su nivel sanguneo de glucosa, anote el resultado y aquellos factores que pueden estar afectando su nivel sanguneo de glucosa, como la dieta y la actividad fsica Dietitian.  La [employer]son de los medidores de glucosa guardan un registro de las lecturas realizadas con el medidor. Algunos permiten descargar sus registros en una computadora.  CMO ME CONTROLO EL NIVEL SANGUNEO DE GLUCOSA? Siga los siguientes pasos para obtener lecturas precisas de su glucemia: Materiales necesarios  Medidor de Horticulturist, commercial.  Tiras reactivas para el medidor. Cada medidor tiene sus propias tiras reactivas. Debe usar las tiras reactivas que trae su  medidor.  Una aguja para pincharse el dedo (lanceta). No utilice la misma lanceta en ms de una ocasin.  Un dispositivo que sujeta la lanceta (dispositivo de puncin).  Un diario o libro de anotaciones para [employer]. Procedimiento  [employer] con agua y [place].  Pnchese el costado del dedo (no la punta) con Optometrist. Use un dedo diferente cada vez.  Frote suavemente el dedo [employer] aparezca una pequea gota de [place].  Siga las instrucciones que vienen con el medidor para Public affairs consultant tira Firefighter, Contractor la sangre sobre la tira y usar el medidor de Horticulturist, commercial.  Registre el resultado y las observaciones que desee. Zonas del cuerpo alternativas para realizar las pruebas  Algunos medidores le permiten tomar sangre para la prueba de otras zonas del cuerpo que no son el dedo (zonas alternativas).  Si cree que tiene hipoglucemia o si tiene hipoglucemia asintomtica, no utilice las zonas alternativas del cuerpo. En su lugar, use los dedos.  Es posible que las zonas alternativas no sean tan precisas como los dedos porque el flujo de sangre es ms lento en esas zonas. Esto significa que el resultado que obtiene de estas zonas puede estar retrasado y ser un poco diferente del resultado que obtendra del dedo.  Los sitios alternativos ms comunes son los siguientes: ? Los antebrazos. ? Los muslos. ? La palma de [employer]. Consejos adicionales  Siempre tenga los insumos a mano.  Todos los medidores de glucosa incluyen un nmero de telfono "directo", disponible las 24 horas, al que podr llamar si tiene preguntas o [place]. Tambin puede consultar a su mdico.  Despus de usar algunas cajas de tiras reactivas, ajuste (calibre) el medidor de glucemia segn las instrucciones del medidor. Esta informacin no tiene Theme park manager el consejo del mdico. Asegrese de hacerle al mdico cualquier pregunta que tenga. Document Released: 05/01/2005  Document Revised: 08/23/2015 Document Reviewed: 10/11/2015 Elsevier Interactive Patient Education  2017 [employer].

## 2017-03-15 NOTE — Progress Notes (Signed)
JA 

## 2017-03-16 LAB — LIPID PANEL
Chol/HDL Ratio: 2.6 ratio (ref 0.0–5.0)
Cholesterol, Total: 143 mg/dL (ref 100–199)
HDL: 56 mg/dL (ref >39)
LDL Calculated: 73 mg/dL (ref 0–99)
TRIGLYCERIDES: 71 mg/dL (ref 0–149)
VLDL Cholesterol Cal: 14 mg/dL (ref 5–40)

## 2017-04-09 ENCOUNTER — Telehealth: Payer: Self-pay

## 2017-04-09 NOTE — Telephone Encounter (Signed)
CMA call regarding lab results   Patient wife answer   Patient wife Verify DOB   Patient wife was aware and understood

## 2017-04-09 NOTE — Telephone Encounter (Signed)
-----   Message from Lizbeth BarkMandesia R Hairston, FNP sent at 04/04/2017  1:27 PM EST ----- Cholesterol levels are normal. Recommend cholesterol screening again in 1 year.

## 2017-06-27 ENCOUNTER — Ambulatory Visit: Payer: Self-pay | Attending: Family Medicine

## 2017-07-21 ENCOUNTER — Encounter (HOSPITAL_COMMUNITY): Payer: Self-pay | Admitting: *Deleted

## 2017-07-21 ENCOUNTER — Emergency Department (HOSPITAL_COMMUNITY): Payer: Self-pay

## 2017-07-21 ENCOUNTER — Emergency Department (HOSPITAL_COMMUNITY)
Admission: EM | Admit: 2017-07-21 | Discharge: 2017-07-21 | Disposition: A | Discharge status: Home / Self Care | Payer: Self-pay | Attending: Emergency Medicine | Admitting: Emergency Medicine

## 2017-07-21 DIAGNOSIS — S81811A Laceration without foreign body, right lower leg, initial encounter: Secondary | ICD-10-CM | POA: Insufficient documentation

## 2017-07-21 DIAGNOSIS — Y929 Unspecified place or not applicable: Secondary | ICD-10-CM | POA: Insufficient documentation

## 2017-07-21 DIAGNOSIS — W293XXA Contact with powered garden and outdoor hand tools and machinery, initial encounter: Secondary | ICD-10-CM | POA: Insufficient documentation

## 2017-07-21 DIAGNOSIS — E119 Type 2 diabetes mellitus without complications: Secondary | ICD-10-CM | POA: Insufficient documentation

## 2017-07-21 DIAGNOSIS — Y998 Other external cause status: Secondary | ICD-10-CM | POA: Insufficient documentation

## 2017-07-21 DIAGNOSIS — Z79899 Other long term (current) drug therapy: Secondary | ICD-10-CM | POA: Insufficient documentation

## 2017-07-21 DIAGNOSIS — Y93H9 Activity, other involving exterior property and land maintenance, building and construction: Secondary | ICD-10-CM | POA: Insufficient documentation

## 2017-07-21 MED ORDER — LIDOCAINE-EPINEPHRINE (PF) 2 %-1:200000 IJ SOLN
20.0000 mL | Freq: Once | INTRAMUSCULAR | Status: AC
  Administered 2017-07-21: 20 mL
  Filled 2017-07-21: qty 20

## 2017-07-21 MED ORDER — LIDOCAINE-EPINEPHRINE-TETRACAINE (LET) SOLUTION
3.0000 mL | Freq: Once | NASAL | Status: DC
  Filled 2017-07-21: qty 3

## 2017-07-21 MED ORDER — CEPHALEXIN 500 MG PO CAPS: 500.0000 mg | ORAL_CAPSULE | Freq: Four times a day (QID) | ORAL | 0 refills | Status: DC

## 2017-07-21 MED ORDER — IBUPROFEN 400 MG PO TABS
600.0000 mg | ORAL_TABLET | Freq: Once | ORAL | Status: AC
  Administered 2017-07-21: 600 mg via ORAL
  Filled 2017-07-21: qty 1

## 2017-07-21 MED ORDER — LIDOCAINE HCL (PF) 1 % IJ SOLN
30.0000 mL | Freq: Once | INTRAMUSCULAR | Status: AC
  Administered 2017-07-21: 30 mL
  Filled 2017-07-21: qty 30

## 2017-07-21 NOTE — Discharge Instructions (Signed)
WOUND CARE °Please have your stitches/staples removed in 7 or sooner if you have concerns. You may do this at any available urgent care or at your primary care doctor's office. ° Keep area clean and dry for 24 hours. Do not remove °bandage, if applied. ° After 24 hours, remove bandage and wash wound °gently with mild soap and warm water. Reapply °a new bandage after cleaning wound, if directed. ° Continue daily cleansing with soap and water until °stitches/staples are removed. ° Do not apply any ointments or creams to the wound °while stitches/staples are in place, as this may cause °delayed healing. ° Seek medical careif you experience any of the following °signs of infection: Swelling, redness, pus drainage, °streaking, fever >101.0 F ° Seek care if you experience excessive bleeding °that does not stop after 15-20 minutes of constant, firm °pressure. ° ° °

## 2017-07-21 NOTE — ED Provider Notes (Signed)
[facility]   CSN: [id number] Arrival date & time: 07/21/17  1521     History   Chief Complaint Chief Complaint  Patient presents with  . Laceration  . Leg Injury    HPI Alison StallingCarlos Roman is a 42 y.o. male.  HPI 42 year old Hispanic male past medical history significant for diabetes presents the emergency department today for evaluation of laceration to the right leg.  Patient states that he was using a chainsaw prior to arrival when it slipped from a tree and hit him to the right shin area.  The patient states the bleeding was controlled with a pressure bandage.  He denies any associated paresthesias or weakness.  Patient has been ambulatory since the event.  States that his tetanus shot is up-to-date.  Patient not taking for pain prior to arrival.  Palpation and range of motion makes the pain worse.  He is able to bend his knee without any pain.  Denies any other injuries. Past Medical History:  Diagnosis Date  . Diabetes mellitus without complication (HCC)     There are no active problems to display for this patient.   History reviewed. No pertinent surgical history.     Home Medications    Prior to Admission medications   Medication Sig Start Date End Date Taking? Authorizing Provider  PRESCRIPTION MEDICATION Diabetic medication. Unknown name. Pt's pharmacy closed   Yes [provider]    Family History No family history on file.  Social History Social History   Tobacco Use  . Smoking status: Not on file  Substance Use Topics  . Alcohol use: Not on file  . Drug use: Not on file     Allergies   Patient has no known allergies.   Review of Systems Review of Systems  All other systems reviewed and are negative.    Physical Exam Updated Vital Signs BP (!) 151/105 (BP Location: Right Arm)   Pulse (!) 110   Temp 99.3 F (37.4 C) (Oral)   Resp 16   SpO2 97%   Physical Exam  Constitutional:  He appears well-developed and well-nourished. No distress.  HENT:  Head: Normocephalic and atraumatic.  Eyes: Right eye exhibits no discharge. Left eye exhibits no discharge. No scleral icterus.  Neck: Normal range of motion.  Pulmonary/Chest: No respiratory distress.  Musculoskeletal: Normal range of motion.  DP pulses are 2+ bilaterally.  Sensation intact.  Brisk cap refill.  Thank 5 out of 5 with flexion and extension of the right knee along with dorsi and plantar flexion of the right foot.  Skin compartments are soft.  Neurological: He is alert.  Sensation intact in all dermatomes.  Skin: Skin is warm. Capillary refill takes less than 2 seconds. No pallor.  Patient with approximately 4 cm laceration to the right anterior tib-fib just distal to the right knee.  Approximately 1.57 m deep.  A small amount of arterial bleeding noted.  Debris noted to the area.  Psychiatric: His behavior is normal. Judgment and thought content normal.  Nursing note and vitals reviewed.    ED Treatments / Results  Labs (all labs ordered are listed, but only abnormal results are displayed) Labs Reviewed - No data to display  EKG  EKG Interpretation None       Radiology Dg Tibia/fibula Right  Result Date: 07/21/2017 CLINICAL DATA:  Anterior and medial right proximal tib/fib pain. Pt injured himself with a chainsaw today. Injury is very close to the  knee joint. Pt denies previous injuries and surgeries to the right leg. EXAM: RIGHT TIBIA AND FIBULA - 2 VIEW COMPARISON:  None. FINDINGS: There is no evidence of fracture or other focal bone lesions. Overlying bandages in place. IMPRESSION: Negative. Electronically Signed   By: Bary Richard M.D.   On: 07/21/2017 16:06    Procedures .Marland KitchenLaceration Repair Date/Time: 07/21/2017 7:09 PM Performed by: Rise Mu, PA-C Authorized by: Rise Mu, PA-C   Consent:    Consent obtained:  Verbal   Consent given by:  Patient   Risks discussed:   Infection, need for additional repair, nerve damage, poor wound healing, poor cosmetic result, retained foreign body, tendon damage, vascular damage and pain   Alternatives discussed:  No treatment Anesthesia (see MAR for exact dosages):    Anesthesia method:  Local infiltration   Local anesthetic:  Lidocaine 1% WITH epi Laceration details:    Location:  Leg   Leg location:  R lower leg   Length (cm):  4   Depth (mm):  10 Repair type:    Repair type:  Intermediate Pre-procedure details:    Preparation:  Patient was prepped and draped in usual sterile fashion and imaging obtained to evaluate for foreign bodies Exploration:    Hemostasis achieved with:  Epinephrine and direct pressure   Wound exploration: wound explored through full range of motion and entire depth of wound probed and visualized     Wound extent: no foreign bodies/material noted and no underlying fracture noted     Contaminated: yes   Treatment:    Area cleansed with:  Betadine and saline   Amount of cleaning:  Extensive   Irrigation solution:  Sterile saline   Irrigation volume:  150   Irrigation method:  Pressure wash   Visualized foreign bodies/material removed: no   Subcutaneous repair:    Suture size:  4-0   Suture material:  Vicryl   Suture technique:  Simple interrupted   Number of sutures:  2 Skin repair:    Repair method:  Sutures   Suture size:  3-0   Suture material:  Prolene   Suture technique: 8 simple interrupted and one horizontal mattress.   Number of sutures:  9 Approximation:    Approximation:  Close   Vermilion border: well-aligned   Post-procedure details:    Dressing:  Adhesive bandage and antibiotic ointment   Patient tolerance of procedure:  Tolerated well, no immediate complications   (including critical care time)  Medications Ordered in ED Medications  lidocaine-EPINEPHrine-tetracaine (LET) solution (3 mLs Topical Not Given 07/21/17 1906)  lidocaine (PF) (XYLOCAINE) 1 % injection  30 mL (30 mLs Infiltration Given 07/21/17 1713)  ibuprofen (ADVIL,MOTRIN) tablet 600 mg (600 mg Oral Given 07/21/17 1712)  lidocaine-EPINEPHrine (XYLOCAINE W/EPI) 2 %-1:200000 (PF) injection 20 mL (20 mLs Infiltration Given 07/21/17 1906)     Initial Impression / Assessment and Plan / ED Course  I have reviewed the triage vital signs and the nursing notes.  Pertinent labs & imaging results that were available during my care of the patient were reviewed by me and considered in my medical decision making (see chart for details).     She presents to the ED with laceration to the right leg from chainsaw.  Tetanus shot is up-to-date.  X-rays were obtained that shows no bony involvement or foreign body noted.  Patient neurovascularly intact.  Good strength with flexion and extension of the right knee.  Pressure irrigation performed due to debris. Laceration  occurred < 8 hours prior to repair which was well tolerated. Pt does have history of diabetes.  Given the debris and mechanism will start on antibiotics to prevent infection.. Discussed suture home care w pt and answered questions. Pt to f-u for wound check and suture removal in 7 days. Pt is hemodynamically stable w no complaints prior to dc.    Pt is hemodynamically stable, in NAD, & able to ambulate in the ED. Evaluation does not show pathology that would require ongoing emergent intervention or inpatient treatment. I explained the diagnosis to the patient. Pain has been managed & has no complaints prior to dc. Pt is comfortable with above plan and is stable for discharge at this time. All questions were answered prior to disposition. Strict return precautions for f/u to the ED were discussed. Encouraged follow up with PCP.    Final Clinical Impressions(s) / ED Diagnoses   Final diagnoses:  Laceration of right lower extremity, initial encounter    ED Discharge Orders        Ordered    cephALEXin (KEFLEX) 500 MG capsule  4 times daily      07/21/17 1910       Wallace Keller 07/21/17 Dessa Phi, MD 07/22/17 [phone]

## 2017-07-21 NOTE — ED Triage Notes (Signed)
To ED for treatment past laceration to right leg with chain saw. Pt states he was cutting a tree when the chain saw slipped and hit shin area. Bleeding controlled with bandage/pressure. Pt was able to stand on leg.

## 2017-07-30 ENCOUNTER — Encounter: Payer: Self-pay | Admitting: Family Medicine

## 2017-07-30 ENCOUNTER — Other Ambulatory Visit: Payer: Self-pay

## 2017-07-30 ENCOUNTER — Emergency Department (HOSPITAL_COMMUNITY)
Admission: EM | Admit: 2017-07-30 | Discharge: 2017-07-30 | Disposition: A | Discharge status: Home / Self Care | Payer: Self-pay | Attending: Emergency Medicine | Admitting: Emergency Medicine

## 2017-07-30 ENCOUNTER — Encounter (HOSPITAL_COMMUNITY): Payer: Self-pay | Admitting: *Deleted

## 2017-07-30 DIAGNOSIS — Z4802 Encounter for removal of sutures: Secondary | ICD-10-CM | POA: Insufficient documentation

## 2017-07-30 NOTE — ED Notes (Signed)
aprox 3 inch lac to rt lower leg area sutured has slight swelling and some redness at suture line, no drainage at site,  Area is open no bandage , pt states he is 2 days over due getting out sutures,

## 2017-07-30 NOTE — ED Triage Notes (Signed)
PT needs sutures to R leg removed.  He also needs paper stating he is healthy enough to return to work.

## 2017-07-30 NOTE — ED Provider Notes (Signed)
[facility] Provider Note   CSN: [id number] Arrival date & time: 07/30/17  [id number]     History   Chief Complaint Chief Complaint  Patient presents with  . Suture / Staple Removal    HPI Richard Morgan is a 42 y.o. male for possible gallbladder disease, who presents to ED for suture removal.  Sutures were placed on March 9 after he changed so that he was using at work from a tree and hit him in the right shin area.  He denies any pain in the area, purulent drainage, additional injuries, fevers.  He believes that the area is healing well.  He has been using the peroxide and spray given to him at the prior visit.  Has no other complaints at this time.  HPI  Past Medical History:  Diagnosis Date  . Diabetes mellitus without complication (HCC)     There are no active problems to display for this patient.   History reviewed. No pertinent surgical history.     Home Medications    Prior to Admission medications   Medication Sig Start Date End Date Taking? Authorizing Provider  cephALEXin (KEFLEX) 500 MG capsule Take 1 capsule (500 mg total) by mouth 4 (four) times daily. 07/21/17   Rise MuLeaphart, Kenneth T, PA-C  PRESCRIPTION MEDICATION Diabetic medication. Unknown name. Pt's pharmacy closed    [provider]    Family History No family history on file.  Social History Social History   Tobacco Use  . Smoking status: Never Smoker  . Smokeless tobacco: Never Used  Substance Use Topics  . Alcohol use: Yes    Frequency: Never    Comment: occ  . Drug use: No     Allergies   Patient has no known allergies.   Review of Systems Review of Systems  Constitutional: Negative for chills and fever.  Respiratory: Negative for shortness of breath.   Cardiovascular: Negative for chest pain.  Musculoskeletal: Negative for joint swelling and myalgias.  Skin: Positive for wound.  Neurological: Negative for weakness, numbness and  headaches.     Physical Exam Updated Vital Signs BP (!) 151/116 (BP Location: Right Arm)   Pulse 85   Temp 97.9 F (36.6 C) (Oral)   Resp 20   Ht 5\' 5"  (1.651 m)   Wt 75.8 kg (167 lb)   SpO2 98%   BMI 27.79 kg/m   Physical Exam  Constitutional: He appears well-developed and well-nourished. No distress.  HENT:  Head: Normocephalic and atraumatic.  Eyes: Conjunctivae and EOM are normal. No scleral icterus.  Neck: Normal range of motion.  Pulmonary/Chest: Effort normal. No respiratory distress.  Neurological: He is alert.  Skin: No rash noted. He is not diaphoretic.  Approximately 4 cm, healing laceration with mild erythema surrounding but no purulent drainage noted on the right tib-fib area just distal to the knee.  Sutures are intact at this time.  Able to ambulate normally.  Able to flex and extend knee without difficulty.  Area appears neurovascularly intact.  Psychiatric: He has a normal mood and affect.  Nursing note and vitals reviewed.    ED Treatments / Results  Labs (all labs ordered are listed, but only abnormal results are displayed) Labs Reviewed - No data to display  EKG  EKG Interpretation None       Radiology No results found.  Procedures .Suture Removal Date/Time: 07/30/2017 9:45 AM Performed by: Dietrich PatesKhatri, Zykera Abella, PA-C Authorized by: Dietrich PatesKhatri, Jacquez Sheetz, PA-C   Consent:  Consent obtained:  Verbal   Consent given by:  Patient   Risks discussed:  Bleeding, pain and wound separation Location:    Location:  Lower extremity   Lower extremity location:  Leg   Leg location:  R lower leg Procedure details:    Wound appearance:  No signs of infection, good wound healing, clean and pink   Number of sutures removed:  9 Post-procedure details:    Patient tolerance of procedure:  Tolerated well, no immediate complications   (including critical care time)  Medications Ordered in ED Medications - No data to display   Initial Impression / Assessment and  Plan / ED Course  I have reviewed the triage vital signs and the nursing notes.  Pertinent labs & imaging results that were available during my care of the patient were reviewed by me and considered in my medical decision making (see chart for details).     Patient presents to ED for evaluation of suture removal.  He accidentally cut the anterior part of his right lower leg after a chain saw incident approximately 9 days ago.  He denies any complaints at this time.  Sutures were removed successfully.  Area appears to be healing well.  Advised to continue keeping the area clean and apply antibiotic ointment.  Patient appears stable for discharge at this time.  Strict return precautions given. Patient's blood pressure initially elevated in the emergency department today. Patient denies headache, change in vision, numbness, weakness, chest pain, dyspnea, dizziness, or lightheadedness therefore doubt hypertensive emergency. Discussed elevated blood pressure with the patient and the need for primary care follow up with potential need to initiate or change antihypertensive medications and or for further evaluation. Discussed return precaution signs/symptoms for hypertensive emergency as listed above with the patient.   Portions of this note were generated with Scientist, clinical (histocompatibility and immunogenetics). Dictation errors may occur despite best attempts at proofreading.   Final Clinical Impressions(s) / ED Diagnoses   Final diagnoses:  Visit for suture removal    ED Discharge Orders    None       Dietrich Pates, PA-C 07/30/17 1610    Azalia Bilis, MD 07/30/17 1535

## 2018-05-10 ENCOUNTER — Encounter (HOSPITAL_COMMUNITY): Payer: Self-pay | Admitting: Emergency Medicine

## 2018-05-10 ENCOUNTER — Ambulatory Visit (HOSPITAL_COMMUNITY)
Admission: EM | Admit: 2018-05-10 | Discharge: 2018-05-10 | Disposition: A | Discharge status: Home / Self Care | Payer: Self-pay | Attending: Family Medicine | Admitting: Family Medicine

## 2018-05-10 DIAGNOSIS — G629 Polyneuropathy, unspecified: Secondary | ICD-10-CM | POA: Insufficient documentation

## 2018-05-10 DIAGNOSIS — F1010 Alcohol abuse, uncomplicated: Secondary | ICD-10-CM | POA: Insufficient documentation

## 2018-05-10 DIAGNOSIS — I10 Essential (primary) hypertension: Secondary | ICD-10-CM | POA: Insufficient documentation

## 2018-05-10 DIAGNOSIS — E1110 Type 2 diabetes mellitus with ketoacidosis without coma: Secondary | ICD-10-CM | POA: Insufficient documentation

## 2018-05-10 LAB — POCT URINALYSIS DIP (DEVICE)
Bilirubin Urine: NEGATIVE
Glucose, UA: >=1000 mg/dL — AB
KETONES UR: 40 mg/dL — AB
Nitrite: NEGATIVE
Protein, ur: NEGATIVE mg/dL
SPECIFIC GRAVITY, URINE: 1.01 (ref 1.005–1.030)
Urobilinogen, UA: 1 mg/dL (ref 0.0–1.0)
pH: 6 (ref 5.0–8.0)

## 2018-05-10 LAB — POCT I-STAT, CHEM 8
BUN: <3 mg/dL — ABNORMAL LOW (ref 6–20)
CHLORIDE: 96 mmol/L — AB (ref 98–111)
Calcium, Ion: 1.15 mmol/L (ref 1.15–1.40)
Creatinine, Ser: 0.7 mg/dL (ref 0.61–1.24)
Glucose, Bld: 300 mg/dL — ABNORMAL HIGH (ref 70–99)
HCT: 50 % (ref 39.0–52.0)
Hemoglobin: 17 g/dL (ref 13.0–17.0)
POTASSIUM: 4.1 mmol/L (ref 3.5–5.1)
Sodium: 136 mmol/L (ref 135–145)
TCO2: 30 mmol/L (ref 22–32)

## 2018-05-10 MED ORDER — METFORMIN HCL 1000 MG PO TABS: 1000.0000 mg | ORAL_TABLET | Freq: Two times a day (BID) | ORAL | 0 refills | Status: DC

## 2018-05-10 MED ORDER — GABAPENTIN 300 MG PO CAPS: 300.0000 mg | ORAL_CAPSULE | Freq: Every day | ORAL | 0 refills | Status: DC

## 2018-05-10 MED ORDER — LISINOPRIL 10 MG PO TABS: 10.0000 mg | ORAL_TABLET | Freq: Every day | ORAL | 0 refills | Status: DC

## 2018-05-10 NOTE — ED Triage Notes (Signed)
Pt sts bilateral leg pain x 4 days; pt sts hx of DM

## 2018-05-10 NOTE — Discharge Instructions (Addendum)
Stop drinking beer Call and find an AA group to attend every day Drink more water Eat a low carbohydrate diabetic diet Walk every day that you area able Follow up with a PCP as soon as an appointment can be made [facility] if you choose to consider staying in hospital to get off the alcohol

## 2018-05-12 NOTE — ED Provider Notes (Signed)
[place]    CSN: [id number] Arrival date & time: 05/10/18  1034     History   Chief Complaint Chief Complaint  Patient presents with  . Leg Pain    HPI Richard Morgan is a 42 y.o. male.   HPI He presents with bilateral leg pain.  States from thighs to feet.  Present all the time.  No numbness or weakness.  No fall or injury or overuse.   Very complicated patient.  Noncompliant insulin dependent diabetic who is not working and not taking any medicines - instead is drinking 8 - 12 beers a day.  Eats very little.  Losing weight.  Brought in by his sister and wife.  Not ready or willing to stop.  Refuses my advice to consider detox outpatient or inpatient.  Sugar today is 300 Pos ketones in urine Also a known hypertension on no meds.  Denies chest pain or SOB or headaches or neuro signs. He denies any low sugars, he does have polyuria and thirst.  Past Medical History:  Diagnosis Date  . Diabetes mellitus without complication [facility])     Patient Active Problem List   Diagnosis Date Noted  . Hyponatremia 12/14/2016  . 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 Hyperglycemia 12/14/2016  . Elevated LFTs 12/14/2016  . Coffee ground emesis 12/14/2016  . Alcohol withdrawal syndrome with complication, with unspecified complication ([place]) 89/37/3428  . Dehydration 12/14/2016  . DKA (diabetic ketoacidoses) ([place]) 12/14/2016    History reviewed. No pertinent surgical history.     Home Medications    Prior to Admission medications   Medication Sig Start Date End Date Taking? Authorizing Provider  Blood Glucose Monitoring Suppl (TRUE METRIX METER) w/Device KIT 1 each by Does not apply route 3 (three) times daily. 01/31/17   [place]gentina Donovan, PA-C  calcium carbonate (TUMS - DOSED IN MG ELEMENTAL CALCIUM) 500 MG chewable tablet Chew 1-2 tablets by mouth 3 (three) times daily as needed for indigestion or heartburn.    [provider]  gabapentin  (NEURONTIN) 300 MG capsule Take 1 capsule (300 mg total) by mouth at bedtime. 05/10/18   Raylene Everts, MD  glucose blood (TRUE METRIX BLOOD GLUCOSE TEST) test strip Use as instructed 01/31/17   Argentina Donovan, PA-C  Insulin Detemir (LEVEMIR FLEXPEN) 100 UNIT/ML Pen Inject 10 Units into the skin daily at 10 pm. 03/15/17   Alfonse Spruce, FNP  Lancets 30G MISC 1 each by Does not apply route 3 (three) times daily. 01/31/17   Argentina Donovan, PA-C  lisinopril (PRINIVIL,ZESTRIL) 10 MG tablet Take 1 tablet (10 mg total) by mouth daily. 05/10/18   Raylene Everts, MD  metFORMIN (GLUCOPHAGE) 1000 MG tablet Take 1 tablet (1,000 mg total) by mouth 2 (two) times daily with a meal. 05/10/18 05/10/19  Raylene Everts, MD  pantoprazole (PROTONIX) 40 MG tablet Take 1 tablet (40 mg total) by mouth 2 (two) times daily. 12/17/16 12/17/17  Benito Mccreedy, MD  phosphorus (K PHOS NEUTRAL) 768-115-726 MG tablet Take 2 tablets (500 mg total) by mouth 2 (two) times daily. 12/17/16   Benito Mccreedy, MD  potassium chloride (K-DUR) 10 MEQ tablet Take 2 tablets (20 mEq total) by mouth daily. 12/17/16   Benito Mccreedy, MD  PRESCRIPTION MEDICATION Diabetic medication. Unknown name. Pt's pharmacy closed    [provider]  thiamine (VITAMIN B-1) 100 MG tablet Take 1 tablet (100 mg total) by mouth daily. 12/17/16   Benito Mccreedy, MD  Family History Family History  Problem Relation Age of Onset  . Diabetes Neg Hx   . Hypertension Neg Hx   . Cancer Neg Hx     Social History Social History   Tobacco Use  . Smoking status: Never Smoker  . Smokeless tobacco: Never Used  Substance Use Topics  . Alcohol use: Yes    Frequency: Never    Comment: occ  . Drug use: No     Allergies   Patient has no known allergies.   Review of Systems Review of Systems  Constitutional: Positive for appetite change, fatigue and unexpected weight change. Negative for chills and fever.  HENT:  Negative for ear pain and sore throat.   Eyes: Negative for photophobia, pain and visual disturbance.  Respiratory: Negative for cough and shortness of breath.   Cardiovascular: Negative for chest pain and palpitations.  Gastrointestinal: Negative for abdominal pain and vomiting.  Endocrine: Positive for polydipsia and polyuria.  Genitourinary: Negative for dysuria and hematuria.  Musculoskeletal: Negative for arthralgias and back pain.  Skin: Negative for color change and rash.  Neurological: Negative for seizures, syncope, weakness, numbness and headaches.  All other systems reviewed and are negative.   Physical Exam Triage Vital Signs ED Triage Vitals [05/10/18 1137]  Enc Vitals Group     BP (!) 162/109     Pulse Rate 99     Resp 18     Temp 98.1 F (36.7 C)     Temp Source Oral     SpO2 98 %     Weight      Height      Head Circumference      Peak Flow      Pain Score 8     Pain Loc      Pain Edu?      Excl. in [place]?    No data found.  Updated Vital Signs BP (!) 162/109 (BP Location: Right Arm)   Pulse 99   Temp 98.1 F (36.7 C) (Oral)   Resp 18   SpO2 98%            Physical Exam Constitutional:      General: He is not in acute distress.    Appearance: He is well-developed. He is ill-appearing.     Comments: Alert.  Conversant.  Poor eye contact.  Appears remorseful  HENT:     Head: Normocephalic and atraumatic.     Right Ear: Tympanic membrane and ear canal normal.     Left Ear: Tympanic membrane and ear canal normal.     Mouth/Throat:     Mouth: Mucous membranes are dry.  Eyes:     Conjunctiva/sclera: Conjunctivae normal.     Pupils: Pupils are equal, round, and reactive to light.  Neck:     Musculoskeletal: Normal range of motion and neck supple.  Cardiovascular:     Rate and Rhythm: Normal rate and regular rhythm.     Pulses: Normal pulses.     Heart sounds: No murmur.  Pulmonary:     Effort: Pulmonary effort is normal. No respiratory distress.       Breath sounds: Normal breath sounds.  Abdominal:     General: Bowel sounds are normal. There is no distension.     Palpations: Abdomen is soft.  Musculoskeletal: Normal range of motion.     Right lower leg: No edema.     Left lower leg: No edema.  Skin:    General: Skin is warm  and dry.  Neurological:     General: No focal deficit present.     Mental Status: He is alert.     Sensory: No sensory deficit.     Comments: General atrophic appearance to muscles      UC Treatments / Results  Labs (all labs ordered are listed, but only abnormal results are displayed) Labs Reviewed  POCT I-STAT, CHEM 8 - Abnormal; Notable for the following components:      Result Value   Chloride 96 (*)    BUN <3 (*)    Glucose, Bld 300 (*)    All other components within normal limits  POCT URINALYSIS DIP (DEVICE) - Abnormal; Notable for the following components:   Glucose, UA >=1000 (*)    Ketones, ur 40 (*)    Hgb urine dipstick TRACE (*)    Leukocytes, UA TRACE (*)    All other components within normal limits  I-STAT CHEM 8, ED    EKG None  Radiology No results found.  Procedures Procedures   Medications Ordered in UC Medications - No data to display  Initial Impression / Assessment and Plan / UC Course  I have reviewed the triage vital signs and the nursing notes.  Pertinent labs & imaging results that were available during my care of the patient were reviewed by me and considered in my medical decision making (see chart for details).     Long discussion with patient and his family. He is at risk for serious diabetes complications and cardiovascular disease.  There are limitations in what I can offer him given his refusal to to for more testing or evaluation, and his inability or unwillingness to quit drinking.  The leg pain could be from diabetic neuropathy or alcoholic neuropathy, uncertain.  HE is offered to resume his oral DM and BP medicines and a trial of gabapentin.   STRONGLY advised to stop drinking and attend daily AA.  Alcohol resources given Final Clinical Impressions(s) / UC Diagnoses   Final diagnoses:  Neuropathy  Alcohol abuse  DM (diabetes mellitus) type 2, uncontrolled, with ketoacidosis ([place])  Hypertension, uncontrolled     Discharge Instructions     Stop drinking beer Call and find an AA group to attend every day Drink more water Eat a low carbohydrate diabetic diet Walk every day that you area able Follow up with a PCP as soon as an appointment can be made Go to [facility] long ER if you choose to consider staying in hospital to get off the alcohol   ED Prescriptions    Medication Sig Dispense Auth. Provider   metFORMIN (GLUCOPHAGE) 1000 MG tablet Take 1 tablet (1,000 mg total) by mouth 2 (two) times daily with a meal. 60 tablet Raylene Everts, MD   lisinopril (PRINIVIL,ZESTRIL) 10 MG tablet Take 1 tablet (10 mg total) by mouth daily. 30 tablet Raylene Everts, MD   gabapentin (NEURONTIN) 300 MG capsule Take 1 capsule (300 mg total) by mouth at bedtime. 30 capsule Raylene Everts, MD     Controlled Substance Prescriptions [place] Controlled Substance Registry consulted? n/a   Raylene Everts, MD 05/12/18 [phone]

## 2018-05-14 ENCOUNTER — Inpatient Hospital Stay (HOSPITAL_COMMUNITY)
Admission: EM | Admit: 2018-05-14 | Discharge: 2018-05-17 | DRG: 637 | Disposition: A | Discharge status: Home / Self Care | Payer: Self-pay | Attending: Internal Medicine | Admitting: Internal Medicine

## 2018-05-14 ENCOUNTER — Emergency Department (HOSPITAL_COMMUNITY): Payer: Self-pay

## 2018-05-14 ENCOUNTER — Encounter (HOSPITAL_COMMUNITY): Payer: Self-pay

## 2018-05-14 DIAGNOSIS — Z79899 Other long term (current) drug therapy: Secondary | ICD-10-CM

## 2018-05-14 DIAGNOSIS — E1110 Type 2 diabetes mellitus with ketoacidosis without coma: Principal | ICD-10-CM

## 2018-05-14 DIAGNOSIS — E860 Dehydration: Secondary | ICD-10-CM | POA: Diagnosis present

## 2018-05-14 DIAGNOSIS — R109 Unspecified abdominal pain: Secondary | ICD-10-CM

## 2018-05-14 DIAGNOSIS — N3000 Acute cystitis without hematuria: Secondary | ICD-10-CM

## 2018-05-14 DIAGNOSIS — E876 Hypokalemia: Secondary | ICD-10-CM

## 2018-05-14 DIAGNOSIS — K30 Functional dyspepsia: Secondary | ICD-10-CM | POA: Diagnosis present

## 2018-05-14 DIAGNOSIS — K859 Acute pancreatitis without necrosis or infection, unspecified: Secondary | ICD-10-CM | POA: Diagnosis present

## 2018-05-14 DIAGNOSIS — N179 Acute kidney failure, unspecified: Secondary | ICD-10-CM

## 2018-05-14 DIAGNOSIS — N390 Urinary tract infection, site not specified: Secondary | ICD-10-CM

## 2018-05-14 DIAGNOSIS — Z794 Long term (current) use of insulin: Secondary | ICD-10-CM

## 2018-05-14 DIAGNOSIS — R1011 Right upper quadrant pain: Secondary | ICD-10-CM

## 2018-05-14 DIAGNOSIS — K37 Unspecified appendicitis: Secondary | ICD-10-CM | POA: Diagnosis present

## 2018-05-14 DIAGNOSIS — F1010 Alcohol abuse, uncomplicated: Secondary | ICD-10-CM

## 2018-05-14 DIAGNOSIS — B951 Streptococcus, group B, as the cause of diseases classified elsewhere: Secondary | ICD-10-CM | POA: Diagnosis present

## 2018-05-14 DIAGNOSIS — E871 Hypo-osmolality and hyponatremia: Secondary | ICD-10-CM

## 2018-05-14 DIAGNOSIS — K760 Fatty (change of) liver, not elsewhere classified: Secondary | ICD-10-CM | POA: Diagnosis present

## 2018-05-14 LAB — COMPREHENSIVE METABOLIC PANEL
ALT: 34 U/L (ref 0–44)
ANION GAP: 31 — AB (ref 5–15)
AST: 40 U/L (ref 15–41)
Albumin: 4.7 g/dL (ref 3.5–5.0)
Alkaline Phosphatase: 63 U/L (ref 38–126)
BUN: 21 mg/dL — ABNORMAL HIGH (ref 6–20)
CO2: 14 mmol/L — ABNORMAL LOW (ref 22–32)
Calcium: 9.7 mg/dL (ref 8.9–10.3)
Chloride: 81 mmol/L — ABNORMAL LOW (ref 98–111)
Creatinine, Ser: 2.77 mg/dL — ABNORMAL HIGH (ref 0.61–1.24)
GFR calc Af Amer: 32 mL/min — ABNORMAL LOW (ref >60)
GFR calc non Af Amer: 27 mL/min — ABNORMAL LOW (ref >60)
Glucose, Bld: 360 mg/dL — ABNORMAL HIGH (ref 70–99)
Potassium: 3.2 mmol/L — ABNORMAL LOW (ref 3.5–5.1)
Sodium: 126 mmol/L — ABNORMAL LOW (ref 135–145)
Total Bilirubin: 2.2 mg/dL — ABNORMAL HIGH (ref 0.3–1.2)
Total Protein: 8.6 g/dL — ABNORMAL HIGH (ref 6.5–8.1)

## 2018-05-14 LAB — CBC
HCT: 45.7 % (ref 39.0–52.0)
Hemoglobin: 16 g/dL (ref 13.0–17.0)
MCH: 33.5 pg (ref 26.0–34.0)
MCHC: 35 g/dL (ref 30.0–36.0)
MCV: 95.8 fL (ref 80.0–100.0)
Platelets: 227 10*3/uL (ref 150–400)
RBC: 4.77 MIL/uL (ref 4.22–5.81)
RDW: 11.1 % — ABNORMAL LOW (ref 11.5–15.5)
WBC: 16.5 10*3/uL — ABNORMAL HIGH (ref 4.0–10.5)
nRBC: 0 % (ref 0.0–0.2)

## 2018-05-14 LAB — URINALYSIS, ROUTINE W REFLEX MICROSCOPIC
Bilirubin Urine: NEGATIVE
Glucose, UA: >=500 mg/dL — AB
Ketones, ur: 80 mg/dL — AB
Nitrite: NEGATIVE
Protein, ur: 30 mg/dL — AB
Specific Gravity, Urine: 1.026 (ref 1.005–1.030)
pH: 5 (ref 5.0–8.0)

## 2018-05-14 LAB — LIPASE, BLOOD: Lipase: 26 U/L (ref 11–51)

## 2018-05-14 LAB — CBG MONITORING, ED
Glucose-Capillary: 464 mg/dL — ABNORMAL HIGH (ref 70–99)
Glucose-Capillary: 437 mg/dL — ABNORMAL HIGH (ref 70–99)
Glucose-Capillary: 268 mg/dL — ABNORMAL HIGH (ref 70–99)

## 2018-05-14 MED ORDER — SODIUM CHLORIDE 0.9 % IV SOLN
INTRAVENOUS | Status: DC
  Administered 2018-05-14: 21:00:00 via INTRAVENOUS

## 2018-05-14 MED ORDER — DEXTROSE-NACL 5-0.45 % IV SOLN: INTRAVENOUS | Status: DC

## 2018-05-14 MED ORDER — MORPHINE SULFATE (PF) 4 MG/ML IV SOLN
4.0000 mg | Freq: Once | INTRAVENOUS | Status: AC
  Administered 2018-05-14: 4 mg via INTRAVENOUS
  Filled 2018-05-14: qty 1

## 2018-05-14 MED ORDER — SODIUM CHLORIDE 0.9 % IV BOLUS
1000.0000 mL | Freq: Once | INTRAVENOUS | Status: AC
  Administered 2018-05-14: 1000 mL via INTRAVENOUS

## 2018-05-14 MED ORDER — SODIUM CHLORIDE 0.9 % IV SOLN
1.0000 g | Freq: Once | INTRAVENOUS | Status: AC
  Administered 2018-05-14: 1 g via INTRAVENOUS
  Filled 2018-05-14: qty 10

## 2018-05-14 MED ORDER — POTASSIUM CHLORIDE 10 MEQ/100ML IV SOLN
10.0000 meq | INTRAVENOUS | Status: AC
  Administered 2018-05-14 (×2): 10 meq via INTRAVENOUS
  Filled 2018-05-14 (×2): qty 100

## 2018-05-14 MED ORDER — INSULIN REGULAR(HUMAN) IN NACL 100-0.9 UT/100ML-% IV SOLN
INTRAVENOUS | Status: DC
  Administered 2018-05-14: 4 [IU]/h via INTRAVENOUS
  Filled 2018-05-14: qty 100

## 2018-05-14 NOTE — H&P (Signed)
History and Physical    [facility] ATF:[id number] DOB: 1975-11-03 DOA: 05/14/2018  Referring MD/NP/PA: Providence Lanius, PA-C PCP: Patient, No Pcp Per  Patient coming from: Home  Chief Complaint: Abdominal pain   I have personally briefly reviewed patient's old medical records in [place]   HPI: Richard Morgan is a 42 y.o. male with medical history significant of diabetes mellitus type 2 and alcohol abuse; who presents with complaints of intermittent right upper quadrant abdominal pain for the last 4 days.  States pain is severe and relates it to hiccups.  Notes associated symptoms of nausea, vomiting, urinary frequency, and diarrhea.  He has had multiple episodes of emesis and reports that he may have seen a small streak of blood present yesterday.  He has been having at least 3 bowel movements per day and denies any blood in stools.  Patient reports that his blood sugars have been reading high for the last 7 days.  He denies having any fevers, chest pain, shortness of breath, recent sick contacts, or dysuria.  He only reports drinking anywhere from 5-8 beers per day on average, but reports that his last alcoholic drink was 2 days ago.  ED Course fracture Upon admission into the emergency department patient was noted to be afebrile, pulse 97-119, respirations 18-19, blood pressure 86/59-114/68, and O2 saturations maintained on room air.  Labs revealed WBC 16.5, sodium 126, potassium 3.2, chloride 81, CO2 14, BUN 21, creatinine 2.77, glucose 260, lipase 26, total bilirubin 2.2, and anion gap 31.  Urinalysis was positive for glucose, large leukocytes, many bacteria, 11-20 squamous epithelial cells, and 21-50 WBCs.  CT scan of the abdomen showed mild haziness of the peri-pancreatic fat was primary for possible mild acute pancreatitis, dilated appendix at 12 mm, hepatic steatosis, and circumferential thickening of the distal esophagus.  General surgery was consulted and  recommended checking right upper quadrant ultrasound.  Patient received 1 L normal saline IV fluids, was given IV Rocephin, and was started on glucose stabilizer protocol.  TRH called to admit.   Review of Systems  Constitutional: Positive for malaise/fatigue. Negative for chills and fever.  HENT: Negative for congestion and nosebleeds.   Eyes: Negative for photophobia and pain.  Respiratory: Negative for cough and shortness of breath.   Cardiovascular: Negative for chest pain and leg swelling.  Gastrointestinal: Positive for abdominal pain, nausea and vomiting.  Genitourinary: Positive for frequency. Negative for dysuria.  Musculoskeletal: Negative for falls and neck pain.  Skin: Negative for itching and rash.  Neurological: Positive for weakness. Negative for loss of consciousness.  Endo/Heme/Allergies: Negative for polydipsia.  Psychiatric/Behavioral: Positive for substance abuse.    Past Medical History:  Diagnosis Date  . Diabetes mellitus without complication ([place])     History reviewed. No pertinent surgical history.   reports that he has never smoked. He has never used smokeless tobacco. He reports current alcohol use. He reports that he does not use drugs.  No Known Allergies  Family History  Problem Relation Age of Onset  . Diabetes Neg Hx   . Hypertension Neg Hx   . Cancer Neg Hx     Prior to Admission medications   Medication Sig Start Date End Date Taking? Authorizing Provider  gabapentin (NEURONTIN) 300 MG capsule Take 1 capsule (300 mg total) by mouth at bedtime. 05/10/18  Yes Raylene Everts, MD  lisinopril (PRINIVIL,ZESTRIL) 10 MG tablet Take 1 tablet (10 mg total) by mouth daily. 05/10/18  Yes Blanchie Serve  Collie Siad, MD  metFORMIN (GLUCOPHAGE) 1000 MG tablet Take 1 tablet (1,000 mg total) by mouth 2 (two) times daily with a meal. 05/10/18 05/10/19 Yes Raylene Everts, MD  Blood Glucose Monitoring Suppl (TRUE METRIX METER) w/Device KIT 1 each by Does not  apply route 3 (three) times daily. 01/31/17   Argentina Donovan, PA-C  glucose blood (TRUE METRIX BLOOD GLUCOSE TEST) test strip Use as instructed 01/31/17   Argentina Donovan, PA-C  Insulin Detemir (LEVEMIR FLEXPEN) 100 UNIT/ML Pen Inject 10 Units into the skin daily at 10 pm. Patient not taking: Reported on 05/14/2018 03/15/17   Alfonse Spruce, FNP  Lancets 30G MISC 1 each by Does not apply route 3 (three) times daily. 01/31/17   Argentina Donovan, PA-C  phosphorus (K PHOS NEUTRAL) [phone] MG tablet Take 2 tablets (500 mg total) by mouth 2 (two) times daily. Patient not taking: Reported on 05/14/2018 12/17/16   Benito Mccreedy, MD  potassium chloride (K-DUR) 10 MEQ tablet Take 2 tablets (20 mEq total) by mouth daily. Patient not taking: Reported on 05/14/2018 12/17/16   Benito Mccreedy, MD  thiamine (VITAMIN B-1) 100 MG tablet Take 1 tablet (100 mg total) by mouth daily. Patient not taking: Reported on 05/14/2018 12/17/16   Benito Mccreedy, MD    Physical Exam:  Constitutional: Middle-age male who appears to be in some discomfort Vitals:   05/14/18 2115 05/14/18 2130 05/14/18 2200 05/14/18 2230  BP: 109/72 112/70 111/70 110/69  Pulse: (!) 108 (!) 103 (!) 103 (!) 113  Resp:    19  Temp:      TempSrc:      SpO2: 100% 100% 100% 99%   Eyes: PERRL, mild scleral icterus appreciated. ENMT: Mucous membranes are dry. Posterior pharynx clear of any exudate or lesions.  Neck: normal, supple, no masses, no thyromegaly Respiratory: clear to auscultation bilaterally, no wheezing, no crackles. Normal respiratory effort. No accessory muscle use.  Cardiovascular: Tachycardic, no murmurs / rubs / gallops. No extremity edema. 2+ pedal pulses. No carotid bruits.  Abdomen: Right upper quadrant tenderness appreciated, no masses palpated. No hepatosplenomegaly. Bowel sounds positive.  Musculoskeletal: no clubbing / cyanosis. No joint deformity upper and lower extremities. Good ROM, no  contractures. Normal muscle tone.  Skin: Poor skin turgor.  No rashes, lesions, ulcers. No induration Neurologic: CN 2-12 grossly intact. Sensation intact, DTR normal. Strength 5/5 in all 4.  Psychiatric: Normal judgment and insight. Alert and oriented x 3. Normal mood.     Labs on Admission: I have personally reviewed following labs and imaging studies  CBC: Recent Labs  Lab 05/10/18 1302 05/14/18 1703  WBC  --  16.5*  HGB 17.0 16.0  HCT 50.0 45.7  MCV  --  95.8  PLT  --  993   Basic Metabolic Panel: Recent Labs  Lab 05/10/18 1302 05/14/18 1703  NA 136 126*  K 4.1 3.2*  CL 96* 81*  CO2  --  14*  GLUCOSE 300* 360*  BUN <3* 21*  CREATININE 0.70 2.77*  CALCIUM  --  9.7   GFR: CrCl cannot be calculated (Unknown ideal weight.). Liver Function Tests: Recent Labs  Lab 05/14/18 1703  AST 40  ALT 34  ALKPHOS 63  BILITOT 2.2*  PROT 8.6*  ALBUMIN 4.7   Recent Labs  Lab 05/14/18 1703  LIPASE 26   No results for input(s): AMMONIA in the last 168 hours. Coagulation Profile: No results for input(s): INR, PROTIME in the last 168 hours. Cardiac  Enzymes: No results for input(s): CKTOTAL, CKMB, CKMBINDEX, TROPONINI in the last 168 hours. BNP (last 3 results) No results for input(s): PROBNP in the last 8760 hours. HbA1C: No results for input(s): HGBA1C in the last 72 hours. CBG: Recent Labs  Lab 05/14/18 2052 05/14/18 2200  GLUCAP 464* 437*   Lipid Profile: No results for input(s): CHOL, HDL, LDLCALC, TRIG, CHOLHDL, LDLDIRECT in the last 72 hours. Thyroid Function Tests: No results for input(s): TSH, T4TOTAL, FREET4, T3FREE, THYROIDAB in the last 72 hours. Anemia Panel: No results for input(s): VITAMINB12, FOLATE, FERRITIN, TIBC, IRON, RETICCTPCT in the last 72 hours. Urine analysis:    Component Value Date/Time   COLORURINE YELLOW 05/14/2018 1700   APPEARANCEUR HAZY (A) 05/14/2018 1700   LABSPEC 1.026 05/14/2018 1700   PHURINE 5.0 05/14/2018 1700    GLUCOSEU >=500 (A) 05/14/2018 1700   HGBUR SMALL (A) 05/14/2018 1700   BILIRUBINUR NEGATIVE 05/14/2018 1700   BILIRUBINUR neg 03/15/2017 1545   KETONESUR 80 (A) 05/14/2018 1700   PROTEINUR 30 (A) 05/14/2018 1700   UROBILINOGEN 1.0 05/10/2018 1319   NITRITE NEGATIVE 05/14/2018 1700   LEUKOCYTESUR LARGE (A) 05/14/2018 1700   Sepsis Labs: No results found for this or any previous visit (from the past 240 hour(s)).   Radiological Exams on Admission: Ct Abdomen Pelvis Wo Contrast  Result Date: 05/14/2018 CLINICAL DATA:  Acute abdominal pain, nausea, vomiting and diarrhea for 3 days. EXAM: CT ABDOMEN AND PELVIS WITHOUT CONTRAST TECHNIQUE: Multidetector CT imaging of the abdomen and pelvis was performed following the standard protocol without IV contrast. COMPARISON:  12/15/2016 right upper quadrant abdominal sonogram. FINDINGS: Lower chest: No significant pulmonary nodules or acute consolidative airspace disease. Circumferential wall thickening in the lower thoracic esophagus. Hepatobiliary: Diffuse hepatic steatosis. No definite liver surface irregularity. No liver mass. Normal liver size. Normal gallbladder with no radiopaque cholelithiasis. No biliary ductal dilatation. Pancreas: Mild haziness of the peripancreatic fat surrounding the pancreatic head and neck, suggesting mild acute pancreatitis. No pancreatic mass or duct dilation. No peripancreatic fluid collections. Spleen: Normal size. No mass. Adrenals/Urinary Tract: Normal adrenals. No hydronephrosis. No renal stones. No contour deforming renal masses. Normal nondistended bladder. Stomach/Bowel: Normal non-distended stomach. Normal caliber small bowel with no small bowel wall thickening. Appendix is dilated (12 mm diameter) and fluid-filled with no definite appendiceal wall thickening or appreciable periappendiceal fat stranding. Normal large bowel with no diverticulosis, large bowel wall thickening or pericolonic fat stranding.  Vascular/Lymphatic: Mildly atherosclerotic nonaneurysmal abdominal aorta. No pathologically enlarged lymph nodes in the abdomen or pelvis. Reproductive: Normal size prostate. Other: No pneumoperitoneum, ascites or focal fluid collection. Musculoskeletal: No aggressive appearing focal osseous lesions. Mild lumbar spondylosis. IMPRESSION: 1. Mild haziness of the peripancreatic fat surrounding the pancreatic head and neck, which may indicate mild acute pancreatitis. No peripancreatic fluid collections. 2. Appendix is dilated (12 mm diameter) and fluid-filled with no definite appendiceal wall thickening or appreciable periappendiceal inflammatory changes. Findings could indicate an appendiceal mucocele, with acute appendicitis not excluded. Surgical consultation may be considered. Short-term follow-up CT abdomen/pelvis with oral and IV contrast may be considered. 3. Circumferential wall thickening in the lower thoracic esophagus, nonspecific, most commonly due to reflux esophagitis, with Barrett's esophagus or neoplasm not excluded by imaging. Further evaluation should be based on clinical assessment. 4. Diffuse hepatic steatosis. 5.  Aortic Atherosclerosis (ICD10-I70.0). Electronically Signed   By: Ilona Sorrel M.D.   On: 05/14/2018 21:40    EKG: Independently reviewed.  Sinus tachycardia 103 bpm  Assessment/Plan Diabetic ketoacidosis:  Acute. Patient presents with blood glucose elevated 360 with CO2 14, and anion gap of 31.  Urinalysis positive for ketones and glucose.  His hemoglobin A1c noted to be 12.3 in November 2018. - Admit to a progressive bed - N.p.o. - Glucose stabilizer protocol initiated - Check hemoglobin A 1C in a.m. - Check BMPs every 4 hours x3 - Monitor for anion gap closure and transition to subcutaneous insulin when medically appropriate - Monitor electrolytes and replace as needed - Diabetic education coordina tor consult   Acute renal failure: Patient baseline creatinine previously  noted to be within normal limits 0.7, but presents with a creatinine of 2.77 with BUN 21.  Suspect prerenal in setting of DKA has no signs of obstruction noted on imaging.   - IV fluids saline at 125 mL/h as tolerated - Recheck kidney function  Suspected urinary tract infection: Acute.  Urinalysis was noted to be normal given concern for possible UTI versus contamination. - Follow-up urine culture - Continue Rocephin IV, and de-escalate when medically appropriate  Possible early pancreatitis: Patient admits to regular use of alcohol and complains of right upper quadrant abdominal pain. CT scan reveals peripancreatic stranding concerning for early pancreatitis.  Initial lipase noted to be within normal limits 26. - Recheck lipase in a.m.  - Follow-up right upper quadrant ultrasound - Continue IV fluids  Dilated appendix possible appendicitis: CT imaging of the abdomen and pelvis revealed 12 mm appendix for which acute appendicitis cannot be ruled out.  General surgery was consulted.  - Appreciate general surgery consultative services will follow up for further recommendations  Hypokalemia: Acute initial potassium noted be 3.2 on admission.  Patient was ordered to be given a total of 20 mEq of potassium chloride IV. - Give additional 20 mEq of potassium chloride IV - Continue to monitor and replace as needed  Alcohol abuse: Patient reports drinking anywhere from 5-8 beers per day.  Last drink reported to be 2 days ago. - CWIA protocols with scheduled ativan  Hyponatremia: Acute.  Sodium 126 on admission, but even when corrected for hyperglycemia noted to be 130. - IV fluids as seen above and continued follow-up   GERD prophylaxis: Protonix IV DVT prophylaxis: Heparin Code Status: Full Family Communication: No family present at bedside Disposition Plan: To be determined Consults called: general suegry Admission status: inpatient   Norval Morton MD [facility] Pager  [phone]   If 7PM-7AM, please contact night-coverage [email or web address] Password [facility]  05/14/2018, 10:56 PM

## 2018-05-14 NOTE — ED Provider Notes (Signed)
[facility] Provider Note   CSN: [id number] Arrival date & time: 05/14/18  1618     History   Chief Complaint Chief Complaint  Patient presents with  . vomiting/diarrhea    HPI Richard Morgan is a 42 y.o. male past medical history of hyponatremia, diabetes, DKA who presents for evaluation of abdominal pain, nausea/vomiting, diarrhea that is been ongoing for last 3 days.  Patient reports that he has had intermittent bilateral upper abdominal pain over the last 3 days.  He states that the pain occurs randomly and that nothing brings it on.  He describes it as a cramping pain.  He states that since onset of symptoms, he has not been able to tolerate any p.o.  Reports multiple episodes of nonbloody, nonbilious vomiting.  Additionally, patient reports he has been having diarrhea.  No blood in stool.  He reports approximately 3 episodes of loose stool a day.  No recent travel outside the country or antibiotic use.  Patient does report that he has history of diabetes and takes metformin which he states he has been compliant with.  Patient states nobody else in the house has had symptoms.  Patient denies any fevers, chest pain, difficulty breathing, urinary complaints.  The history is provided by the patient.    Past Medical History:  Diagnosis Date  . Diabetes mellitus without complication [facility])     Patient Active Problem List   Diagnosis Date Noted  . Acute renal failure (ARF) ([place]) 05/15/2018  . Acute lower UTI 05/15/2018  . Hypokalemia 05/15/2018  . Alcohol abuse 05/15/2018  . Hyponatremia 12/14/2016  . 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 Hyperglycemia 12/14/2016  . Elevated LFTs 12/14/2016  . Coffee ground emesis 12/14/2016  . Alcohol withdrawal syndrome with complication, with unspecified complication ([place]) 29/51/8841  . Dehydration 12/14/2016  . DKA (diabetic ketoacidoses) ([place]) 12/14/2016    History reviewed. No pertinent  surgical history.      Home Medications    Prior to Admission medications   Medication Sig Start Date End Date Taking? Authorizing Provider  gabapentin (NEURONTIN) 300 MG capsule Take 1 capsule (300 mg total) by mouth at bedtime. 05/10/18  Yes Raylene Everts, MD  lisinopril (PRINIVIL,ZESTRIL) 10 MG tablet Take 1 tablet (10 mg total) by mouth daily. 05/10/18  Yes Raylene Everts, MD  metFORMIN (GLUCOPHAGE) 1000 MG tablet Take 1 tablet (1,000 mg total) by mouth 2 (two) times daily with a meal. 05/10/18 05/10/19 Yes Raylene Everts, MD  Blood Glucose Monitoring Suppl (TRUE METRIX METER) w/Device KIT 1 each by Does not apply route 3 (three) times daily. 01/31/17   Argentina Donovan, PA-C  glucose blood (TRUE METRIX BLOOD GLUCOSE TEST) test strip Use as instructed 01/31/17   Argentina Donovan, PA-C  Insulin Detemir (LEVEMIR FLEXPEN) 100 UNIT/ML Pen Inject 10 Units into the skin daily at 10 pm. Patient not taking: Reported on 05/14/2018 03/15/17   Alfonse Spruce, FNP  Lancets 30G MISC 1 each by Does not apply route 3 (three) times daily. 01/31/17   Argentina Donovan, PA-C  phosphorus (K PHOS NEUTRAL) 660-630-160 MG tablet Take 2 tablets (500 mg total) by mouth 2 (two) times daily. Patient not taking: Reported on 05/14/2018 12/17/16   Benito Mccreedy, MD  potassium chloride (K-DUR) 10 MEQ tablet Take 2 tablets (20 mEq total) by mouth daily. Patient not taking: Reported on 05/14/2018 12/17/16   Benito Mccreedy, MD  thiamine (VITAMIN B-1) 100 MG tablet Take  1 tablet (100 mg total) by mouth daily. Patient not taking: Reported on 05/14/2018 12/17/16   Benito Mccreedy, MD    Family History Family History  Problem Relation Age of Onset  . Diabetes Neg Hx   . Hypertension Neg Hx   . Cancer Neg Hx     Social History Social History   Tobacco Use  . Smoking status: Never Smoker  . Smokeless tobacco: Never Used  Substance Use Topics  . Alcohol use: Yes    Frequency: Never     Comment: occ  . Drug use: No     Allergies   Patient has no known allergies.   Review of Systems Review of Systems  Constitutional: Positive for appetite change. Negative for fever.  Respiratory: Negative for cough and shortness of breath.   Cardiovascular: Negative for chest pain.  Gastrointestinal: Positive for abdominal pain, diarrhea, nausea and vomiting.  Genitourinary: Negative for dysuria and hematuria.  Neurological: Negative for headaches.  All other systems reviewed and are negative.    Physical Exam Updated Vital Signs BP (!) 129/91 (BP Location: Right Arm)   Pulse (!) 116   Temp 97.7 F (36.5 C) (Oral)   Resp 14   Ht '5\' 4"'  (1.626 m)   Wt 70 kg   SpO2 100%   BMI 26.49 kg/m   Physical Exam Vitals signs and nursing note reviewed.  Constitutional:      Appearance: Normal appearance. He is well-developed.     Comments: Appears uncomfortable  HENT:     Head: Normocephalic and atraumatic.  Eyes:     General: Lids are normal.     Conjunctiva/sclera: Conjunctivae normal.     Pupils: Pupils are equal, round, and reactive to light.  Neck:     Musculoskeletal: Full passive range of motion without pain.  Cardiovascular:     Rate and Rhythm: Normal rate and regular rhythm.     Pulses: Normal pulses.     Heart sounds: Normal heart sounds. No murmur. No friction rub. No gallop.   Pulmonary:     Effort: Pulmonary effort is normal.     Breath sounds: Normal breath sounds.     Comments: Lungs clear to auscultation bilaterally.  Symmetric chest rise.  No wheezing, rales, rhonchi.  Abdominal:     Palpations: Abdomen is soft. Abdomen is not rigid.     Tenderness: There is abdominal tenderness in the right upper quadrant and left upper quadrant. There is no right CVA tenderness, left CVA tenderness or guarding.     Comments: Abdomen is soft, nondistended.  Tenderness noted to the upper abdomen, most notably at the right upper quadrant.  No CVA tenderness bilaterally.    Musculoskeletal: Normal range of motion.  Skin:    General: Skin is warm and dry.     Capillary Refill: Capillary refill takes less than 2 seconds.  Neurological:     Mental Status: He is alert and oriented to person, place, and time.  Psychiatric:        Speech: Speech normal.      ED Treatments / Results  Labs (all labs ordered are listed, but only abnormal results are displayed) Labs Reviewed  COMPREHENSIVE METABOLIC PANEL - Abnormal; Notable for the following components:      Result Value   Sodium 126 (*)    Potassium 3.2 (*)    Chloride 81 (*)    CO2 14 (*)    Glucose, Bld 360 (*)    BUN 21 (*)  Creatinine, Ser 2.77 (*)    Total Protein 8.6 (*)    Total Bilirubin 2.2 (*)    GFR calc non Af Amer 27 (*)    GFR calc Af Amer 32 (*)    Anion gap 31 (*)    All other components within normal limits  CBC - Abnormal; Notable for the following components:   WBC 16.5 (*)    RDW 11.1 (*)    All other components within normal limits  URINALYSIS, ROUTINE W REFLEX MICROSCOPIC - Abnormal; Notable for the following components:   APPearance HAZY (*)    Glucose, UA >=500 (*)    Hgb urine dipstick SMALL (*)    Ketones, ur 80 (*)    Protein, ur 30 (*)    Leukocytes, UA LARGE (*)    Bacteria, UA MANY (*)    All other components within normal limits  CBC - Abnormal; Notable for the following components:   WBC 15.7 (*)    RDW 11.4 (*)    All other components within normal limits  BASIC METABOLIC PANEL - Abnormal; Notable for the following components:   Sodium 129 (*)    Chloride 88 (*)    CO2 16 (*)    Glucose, Bld 173 (*)    BUN 31 (*)    Creatinine, Ser 2.61 (*)    GFR calc non Af Amer 29 (*)    GFR calc Af Amer 34 (*)    Anion gap 25 (*)    All other components within normal limits  BASIC METABOLIC PANEL - Abnormal; Notable for the following components:   Sodium 129 (*)    Potassium 3.4 (*)    Chloride 87 (*)    CO2 18 (*)    Glucose, Bld 144 (*)    BUN 32 (*)     Creatinine, Ser 2.33 (*)    GFR calc non Af Amer 34 (*)    GFR calc Af Amer 39 (*)    Anion gap 24 (*)    All other components within normal limits  HEMOGLOBIN A1C - Abnormal; Notable for the following components:   Hgb A1c MFr Bld 11.0 (*)    All other components within normal limits  GLUCOSE, CAPILLARY - Abnormal; Notable for the following components:   Glucose-Capillary 125 (*)    All other components within normal limits  BLOOD GAS, ARTERIAL - Abnormal; Notable for the following components:   Acid-base deficit 3.4 (*)    All other components within normal limits  GLUCOSE, CAPILLARY - Abnormal; Notable for the following components:   Glucose-Capillary 153 (*)    All other components within normal limits  GLUCOSE, CAPILLARY - Abnormal; Notable for the following components:   Glucose-Capillary 196 (*)    All other components within normal limits  CBG MONITORING, ED - Abnormal; Notable for the following components:   Glucose-Capillary 464 (*)    All other components within normal limits  CBG MONITORING, ED - Abnormal; Notable for the following components:   Glucose-Capillary 437 (*)    All other components within normal limits  CBG MONITORING, ED - Abnormal; Notable for the following components:   Glucose-Capillary 268 (*)    All other components within normal limits  CBG MONITORING, ED - Abnormal; Notable for the following components:   Glucose-Capillary 213 (*)    All other components within normal limits  CBG MONITORING, ED - Abnormal; Notable for the following components:   Glucose-Capillary 188 (*)    All other  components within normal limits  CBG MONITORING, ED - Abnormal; Notable for the following components:   Glucose-Capillary 163 (*)    All other components within normal limits  CBG MONITORING, ED - Abnormal; Notable for the following components:   Glucose-Capillary 154 (*)    All other components within normal limits  CBG MONITORING, ED - Abnormal; Notable for the  following components:   Glucose-Capillary 112 (*)    All other components within normal limits  URINE CULTURE  LIPASE, BLOOD  LIPASE, BLOOD  HIV ANTIBODY (ROUTINE TESTING W REFLEX)  BASIC METABOLIC PANEL  LACTIC ACID, PLASMA  LACTIC ACID, PLASMA  BASIC METABOLIC PANEL    EKG EKG Interpretation  Date/Time:  Tuesday May 14 2018 23:40:12 EST Ventricular Rate:  103 PR Interval:    QRS Duration: 84 QT Interval:  339 QTC Calculation: 444 R Axis:   102 Text Interpretation:  Sinus tachycardia Right axis deviation Borderline T wave abnormalities No significant change since last tracing Confirmed by Pryor Curia [phone]) on 05/14/2018 11:48:01 PM   Radiology Ct Abdomen Pelvis Wo Contrast  Result Date: 05/14/2018 CLINICAL DATA:  Acute abdominal pain, nausea, vomiting and diarrhea for 3 days. EXAM: CT ABDOMEN AND PELVIS WITHOUT CONTRAST TECHNIQUE: Multidetector CT imaging of the abdomen and pelvis was performed following the standard protocol without IV contrast. COMPARISON:  12/15/2016 right upper quadrant abdominal sonogram. FINDINGS: Lower chest: No significant pulmonary nodules or acute consolidative airspace disease. Circumferential wall thickening in the lower thoracic esophagus. Hepatobiliary: Diffuse hepatic steatosis. No definite liver surface irregularity. No liver mass. Normal liver size. Normal gallbladder with no radiopaque cholelithiasis. No biliary ductal dilatation. Pancreas: Mild haziness of the peripancreatic fat surrounding the pancreatic head and neck, suggesting mild acute pancreatitis. No pancreatic mass or duct dilation. No peripancreatic fluid collections. Spleen: Normal size. No mass. Adrenals/Urinary Tract: Normal adrenals. No hydronephrosis. No renal stones. No contour deforming renal masses. Normal nondistended bladder. Stomach/Bowel: Normal non-distended stomach. Normal caliber small bowel with no small bowel wall thickening. Appendix is dilated (12 mm diameter)  and fluid-filled with no definite appendiceal wall thickening or appreciable periappendiceal fat stranding. Normal large bowel with no diverticulosis, large bowel wall thickening or pericolonic fat stranding. Vascular/Lymphatic: Mildly atherosclerotic nonaneurysmal abdominal aorta. No pathologically enlarged lymph nodes in the abdomen or pelvis. Reproductive: Normal size prostate. Other: No pneumoperitoneum, ascites or focal fluid collection. Musculoskeletal: No aggressive appearing focal osseous lesions. Mild lumbar spondylosis. IMPRESSION: 1. Mild haziness of the peripancreatic fat surrounding the pancreatic head and neck, which may indicate mild acute pancreatitis. No peripancreatic fluid collections. 2. Appendix is dilated (12 mm diameter) and fluid-filled with no definite appendiceal wall thickening or appreciable periappendiceal inflammatory changes. Findings could indicate an appendiceal mucocele, with acute appendicitis not excluded. Surgical consultation may be considered. Short-term follow-up CT abdomen/pelvis with oral and IV contrast may be considered. 3. Circumferential wall thickening in the lower thoracic esophagus, nonspecific, most commonly due to reflux esophagitis, with Barrett's esophagus or neoplasm not excluded by imaging. Further evaluation should be based on clinical assessment. 4. Diffuse hepatic steatosis. 5.  Aortic Atherosclerosis (ICD10-I70.0). Electronically Signed   By: Ilona Sorrel M.D.   On: 05/14/2018 21:40   US Abdomen Limited Ruq  Result Date: 05/15/2018 CLINICAL DATA:  Right upper quadrant pain for 3 days EXAM: ULTRASOUND ABDOMEN LIMITED RIGHT UPPER QUADRANT COMPARISON:  None. FINDINGS: Gallbladder: No gallstones or wall thickening visualized. No sonographic Murphy sign noted by sonographer. Common bile duct: Diameter: 5 mm Liver: Hyperechoic liver parenchyma. Portal  vein is patent on color Doppler imaging with normal direction of blood flow towards the liver. IMPRESSION: 1.  Normal gallbladder. 2. Hepatic steatosis. Electronically Signed   By: Ulyses Jarred M.D.   On: 05/15/2018 00:18    Procedures .Critical Care Performed by: Volanda Napoleon, PA-C Authorized by: Volanda Napoleon, PA-C   Critical care provider statement:    Critical care time (minutes):  45   Critical care was necessary to treat or prevent imminent or life-threatening deterioration of the following conditions:  Endocrine crisis, dehydration and metabolic crisis   Critical care was time spent personally by me on the following activities:  Discussions with consultants, evaluation of patient's response to treatment, examination of patient, ordering and performing treatments and interventions, ordering and review of laboratory studies, ordering and review of radiographic studies, pulse oximetry, re-evaluation of patient's condition, obtaining history from patient or surrogate and review of old charts   (including critical care time)  Medications Ordered in ED Medications  dextrose 5 %-0.45 % sodium chloride infusion ( Intravenous New Bag/Given 05/15/18 0104)  insulin regular bolus via infusion 0-10 Units (has no administration in time range)  insulin regular, human (MYXREDLIN) 100 units/ 100 mL infusion (4.1 Units/hr Intravenous Rate/Dose Change 05/15/18 0911)  dextrose 50 % solution 25 mL (has no administration in time range)  heparin injection 5,000 Units (5,000 Units Subcutaneous Given 05/15/18 0629)  sodium chloride flush (NS) 0.9 % injection 3 mL (3 mLs Intravenous Not Given 05/15/18 0801)  ondansetron (ZOFRAN) tablet 4 mg ( Oral See Alternative 05/15/18 0908)    Or  ondansetron (ZOFRAN) injection 4 mg (4 mg Intravenous Given 05/15/18 0908)  acetaminophen (TYLENOL) tablet 650 mg (has no administration in time range)    Or  acetaminophen (TYLENOL) suppository 650 mg (has no administration in time range)  albuterol (PROVENTIL) (2.5 MG/3ML) 0.083% nebulizer solution 2.5 mg (has no administration in  time range)  0.9 %  sodium chloride infusion ( Intravenous Stopped 05/15/18 0800)  LORazepam (ATIVAN) tablet 1 mg (has no administration in time range)    Or  LORazepam (ATIVAN) injection 1 mg (has no administration in time range)  thiamine (VITAMIN B-1) tablet 100 mg (has no administration in time range)    Or  thiamine (B-1) injection 100 mg (has no administration in time range)  folic acid (FOLVITE) tablet 1 mg (has no administration in time range)  multivitamin with minerals tablet 1 tablet (has no administration in time range)  LORazepam (ATIVAN) injection 0-4 mg (2 mg Intravenous Given 05/15/18 0239)    Followed by  LORazepam (ATIVAN) injection 0-4 mg (has no administration in time range)  pantoprazole (PROTONIX) injection 40 mg (40 mg Intravenous Given 05/15/18 0207)  morphine 2 MG/ML injection 2 mg (has no administration in time range)  cefTRIAXone (ROCEPHIN) 1 g in sodium chloride 0.9 % 100 mL IVPB (has no administration in time range)  potassium chloride 10 mEq in 100 mL IVPB (10 mEq Intravenous New Bag/Given 05/15/18 0922)  magnesium sulfate IVPB 1 g 100 mL (has no administration in time range)  potassium chloride 10 mEq in 100 mL IVPB (0 mEq Intravenous Stopped 05/14/18 2313)  sodium chloride 0.9 % bolus 1,000 mL (0 mLs Intravenous Stopped 05/14/18 2215)  morphine 4 MG/ML injection 4 mg (4 mg Intravenous Given 05/14/18 2051)  cefTRIAXone (ROCEPHIN) 1 g in sodium chloride 0.9 % 100 mL IVPB (0 g Intravenous Stopped 05/14/18 2310)  potassium chloride 10 mEq in 100 mL IVPB (0 mEq Intravenous  Stopped 05/15/18 0530)     Initial Impression / Assessment and Plan / ED Course  I have reviewed the triage vital signs and the nursing notes.  Pertinent labs & imaging results that were available during my care of the patient were reviewed by me and considered in my medical decision making (see chart for details).     42 year old male who presents for evaluation of 3 days of abdominal pain,  nausea, vomiting, diarrhea.  States he has not been able to tolerate p.o. over the last 3 days.  No fevers, chest pain, difficulty breathing, urinary complaints.  On initial exam, he is afebrile but is tachycardic.  On exam, he has tenderness noted to the upper abdomen bilaterally but most notably in the right upper quadrant.  Consider infectious etiology versus DKA versus pancreatitis versus hepatobiliary etiology.  Initial labs ordered at triage.  Lipase unremarkable.  CBC shows leukocytosis of 16.5.  CMP shows sodium 126, potassium 3.2, bicarb of 14, glucose of 360.  BUN and creatinine are 21 and 2.77 respectively, which is new.  AST, ALT and alk phos are unremarkable.  Patient with an anion gap of 31.  Elevation in BUN and creatinine are new.  Additionally, hyponatremia is new.  UA shows ketones.  Additionally, there is large leukocytes, pyuria.  There is squamous epithelium so question contaminant.  Patient given a dose of Rocephin here in the ED.  Given lab work-up, concern for DKA.  Insulin drip initiated as well as fluid.  CT abd/pelvis shows mild haziness surrounding the pancreas that may indicate some acute pancreatitis.  Lipase is unremarkable.  Additionally, appendix is dilated 12 mm and is fluid-filled.  No definite wall thickening or inflammatory changes.  Gallbladder within normal limits.  Discussed patient with Dr. Brantley Stage (Gen Surg) regarding findings on CT scan.  He will plan to consult on patient tomorrow.  Recommends obtaining right upper quadrant ultrasound for evaluation.  Given findings on the skin, no indication for emergent surgery at this time. Hospitalist consulted for admission.    Final Clinical Impressions(s) / ED Diagnoses   Final diagnoses:  Abdominal pain  Diabetic ketoacidosis without coma associated with type 2 diabetes mellitus ([place])  Acute cystitis without hematuria  RUQ abdominal pain  Acute kidney injury [facility])  Hyponatremia    ED Discharge Orders    None        Volanda Napoleon, PA-C 05/15/18 0945    Julianne Rice, MD 05/18/18 [phone]

## 2018-05-14 NOTE — ED Triage Notes (Signed)
Patient complains of 3 days of abdominal cramping with vomiting and diarrhea. Decreased intake and no one else at home or work with same. Alert and oriented, no active vomiting

## 2018-05-14 NOTE — Consult Note (Signed)
Reason for Consult: Abdominal pain nausea vomiting x5 days   Referring Physician: Ranae PalmsYelverton MD  [facility] Richard Morgan is an 42 y.o. male.  HPI: Asked see the patient request of Dr. Louanna RawYelvington secondary to abdominal pain.  The patient states his pain is in his right upper quadrant.  Is been present for the last 4 days to 5 days.  He has associated nausea and vomiting.  He has diabetes mellitus and was found to be in DKA in the emergency room.  CT scan showed a dilation of his appendix without inflammation.  He denies any right lower quadrant abdominal pain or back pain.  He is thirsty.  He was vomiting in the room as I entered.  He states his pain is up under his right rib cage.  He points to his right rib area.  The pain does not radiate.  It is sharp in nature.  No significant radiations.  CT scan shows dilation of his appendix.  No obvious gallstones on CT scanning.  His white count is 16,000.  He had a pain like this about 6 months ago he thinks.  Past Medical History:  Diagnosis Date  . Diabetes mellitus without complication (HCC)     History reviewed. No pertinent surgical history.  Family History  Problem Relation Age of Onset  . Diabetes Neg Hx   . Hypertension Neg Hx   . Cancer Neg Hx     Social History:  reports that he has never smoked. He has never used smokeless tobacco. He reports current alcohol use. He reports that he does not use drugs.  Allergies: No Known Allergies  Medications: I have reviewed the patient's current medications.  Results for orders placed or performed during the hospital encounter of 05/14/18 (from the past 48 hour(s))  Urinalysis, Routine w reflex microscopic     Status: Abnormal   Collection Time: 05/14/18  5:00 PM  Result Value Ref Range   Color, Urine YELLOW YELLOW   APPearance HAZY (A) CLEAR   Specific Gravity, Urine 1.026 1.005 - 1.030   pH 5.0 5.0 - 8.0   Glucose, UA >=500 (A) NEGATIVE mg/dL   Hgb urine dipstick SMALL (A) NEGATIVE    Bilirubin Urine NEGATIVE NEGATIVE   Ketones, ur 80 (A) NEGATIVE mg/dL   Protein, ur 30 (A) NEGATIVE mg/dL   Nitrite NEGATIVE NEGATIVE   Leukocytes, UA LARGE (A) NEGATIVE   RBC / HPF 6-10 0 - 5 RBC/hpf   WBC, UA 21-50 0 - 5 WBC/hpf   Bacteria, UA MANY (A) NONE SEEN   Squamous Epithelial / LPF 11-20 0 - 5   Mucus PRESENT     Comment: Performed at [facility], [address] [place], [place] [address]  Lipase, blood     Status: None   Collection Time: 05/14/18  5:03 PM  Result Value Ref Range   Lipase 26 11 - 51 U/L    Comment: Performed at [facility], [address] [place], [place] [address]  Comprehensive metabolic panel     Status: Abnormal   Collection Time: 05/14/18  5:03 PM  Result Value Ref Range   Sodium 126 (L) 135 - 145 mmol/L   Potassium 3.2 (L) 3.5 - 5.1 mmol/L   Chloride 81 (L) 98 - 111 mmol/L   CO2 14 (L) 22 - 32 mmol/L   Glucose, Bld 360 (H) 70 - 99 mg/dL   BUN 21 (H) 6 - 20 mg/dL   Creatinine, Ser 2.532.77 (H)  0.61 - 1.24 mg/dL   Calcium 9.7 8.9 - 60.4 mg/dL   Total Protein 8.6 (H) 6.5 - 8.1 g/dL   Albumin 4.7 3.5 - 5.0 g/dL   AST 40 15 - 41 U/L   ALT 34 0 - 44 U/L   Alkaline Phosphatase 63 38 - 126 U/L   Total Bilirubin 2.2 (H) 0.3 - 1.2 mg/dL   GFR calc non Af Amer 27 (L) >60 mL/min   GFR calc Af Amer 32 (L) >60 mL/min   Anion gap 31 (H) 5 - 15    Comment: Performed at [facility], [address] [place], [place] [address]  CBC     Status: Abnormal   Collection Time: 05/14/18  5:03 PM  Result Value Ref Range   WBC 16.5 (H) 4.0 - 10.5 K/uL   RBC 4.77 4.22 - 5.81 MIL/uL   Hemoglobin 16.0 13.0 - 17.0 g/dL   HCT 11.9 14.7 - 82.9 %   MCV 95.8 80.0 - 100.0 fL   MCH 33.5 26.0 - 34.0 pg   MCHC 35.0 30.0 - 36.0 g/dL   RDW 56.2 (L) 13.0 - 86.5 %   Platelets 227 150 - 400 K/uL   nRBC 0.0 0.0 - 0.2 %    Comment: Performed at [facility], [address], [place], [place] [address]  CBG monitoring, ED     Status: Abnormal   Collection  Time: 05/14/18  8:52 PM  Result Value Ref Range   Glucose-Capillary 464 (H) 70 - 99 mg/dL  CBG monitoring, ED     Status: Abnormal   Collection Time: 05/14/18 10:00 PM  Result Value Ref Range   Glucose-Capillary 437 (H) 70 - 99 mg/dL   Comment 1 Notify RN    Comment 2 Document in Chart     Ct Abdomen Pelvis Wo Contrast  Result Date: 05/14/2018 CLINICAL DATA:  Acute abdominal pain, nausea, vomiting and diarrhea for 3 days. EXAM: CT ABDOMEN AND PELVIS WITHOUT CONTRAST TECHNIQUE: Multidetector CT imaging of the abdomen and pelvis was performed following the standard protocol without IV contrast. COMPARISON:  12/15/2016 right upper quadrant abdominal sonogram. FINDINGS: Lower chest: No significant pulmonary nodules or acute consolidative airspace disease. Circumferential wall thickening in the lower thoracic esophagus. Hepatobiliary: Diffuse hepatic steatosis. No definite liver surface irregularity. No liver mass. Normal liver size. Normal gallbladder with no radiopaque cholelithiasis. No biliary ductal dilatation. Pancreas: Mild haziness of the peripancreatic fat surrounding the pancreatic head and neck, suggesting mild acute pancreatitis. No pancreatic mass or duct dilation. No peripancreatic fluid collections. Spleen: Normal size. No mass. Adrenals/Urinary Tract: Normal adrenals. No hydronephrosis. No renal stones. No contour deforming renal masses. Normal nondistended bladder. Stomach/Bowel: Normal non-distended stomach. Normal caliber small bowel with no small bowel wall thickening. Appendix is dilated (12 mm diameter) and fluid-filled with no definite appendiceal wall thickening or appreciable periappendiceal fat stranding. Normal large bowel with no diverticulosis, large bowel wall thickening or pericolonic fat stranding. Vascular/Lymphatic: Mildly atherosclerotic nonaneurysmal abdominal aorta. No pathologically enlarged lymph nodes in the abdomen or pelvis. Reproductive: Normal size prostate.  Other: No pneumoperitoneum, ascites or focal fluid collection. Musculoskeletal: No aggressive appearing focal osseous lesions. Mild lumbar spondylosis. IMPRESSION: 1. Mild haziness of the peripancreatic fat surrounding the pancreatic head and neck, which may indicate mild acute pancreatitis. No peripancreatic fluid collections. 2. Appendix is dilated (12 mm diameter) and fluid-filled with no definite appendiceal wall thickening or appreciable periappendiceal inflammatory changes. Findings could indicate an appendiceal mucocele, with  acute appendicitis not excluded. Surgical consultation may be considered. Short-term follow-up CT abdomen/pelvis with oral and IV contrast may be considered. 3. Circumferential wall thickening in the lower thoracic esophagus, nonspecific, most commonly due to reflux esophagitis, with Barrett's esophagus or neoplasm not excluded by imaging. Further evaluation should be based on clinical assessment. 4. Diffuse hepatic steatosis. 5.  Aortic Atherosclerosis (ICD10-I70.0). Electronically Signed   By: Delbert PhenixJason A Poff M.D.   On: 05/14/2018 21:40    Review of Systems  Constitutional: Positive for malaise/fatigue. Negative for chills and fever.  HENT: Negative for hearing loss and tinnitus.   Eyes: Negative for blurred vision and double vision.  Respiratory: Negative for hemoptysis.   Cardiovascular: Negative for chest pain and palpitations.  Gastrointestinal: Positive for diarrhea, nausea and vomiting.  Genitourinary: Negative.   Musculoskeletal: Negative.   Skin: Negative for itching and rash.  Neurological: Negative.   Endo/Heme/Allergies: Negative.   Psychiatric/Behavioral: Negative for suicidal ideas.   Blood pressure 112/70, pulse (!) 103, temperature 98 F (36.7 C), temperature source Oral, resp. rate 18, SpO2 100 %. Physical Exam  Constitutional: He is oriented to person, place, and time. He appears well-developed and well-nourished.  HENT:  Head: Normocephalic and  atraumatic.  Eyes: Pupils are equal, round, and reactive to light. EOM are normal.  Neck: Normal range of motion. Neck supple.  Cardiovascular: Normal rate and regular rhythm.  Respiratory: Effort normal and breath sounds normal.  GI: There is abdominal tenderness in the right upper quadrant and left lower quadrant. There is positive Murphy's sign. There is no rigidity, no guarding and no tenderness at McBurney's point.  Musculoskeletal: Normal range of motion.  Neurological: He is alert and oriented to person, place, and time.  Skin: Skin is warm.  Psychiatric: He has a normal mood and affect. His behavior is normal.    Assessment/Plan: Abdominal pain  CT scan shows dilation of his appendix but there is fluid in the appendix and no fecalith.  There is no associated inflammation.  He is complaining more right upper quadrant pain is tender with a Murphy sign.  Will obtain ultrasound of gallbladder to evaluate for cholecystitis.  He has been admitted to medicine for DKA and we will follow his abdominal examination as well.  Recommend keeping him n.p.o. if possible for now except for maybe some ice chips for his mouth.  Kanija Remmel A Abagayle Klutts 05/14/2018, 10:30 PM

## 2018-05-14 NOTE — ED Notes (Signed)
Pt ambulatory to bathroom

## 2018-05-14 NOTE — ED Notes (Signed)
Patient transported to CT 

## 2018-05-14 NOTE — ED Notes (Signed)
Pt CBG was 437, notified Kayla(RN)

## 2018-05-15 DIAGNOSIS — F1010 Alcohol abuse, uncomplicated: Secondary | ICD-10-CM | POA: Diagnosis present

## 2018-05-15 DIAGNOSIS — E876 Hypokalemia: Secondary | ICD-10-CM | POA: Diagnosis present

## 2018-05-15 DIAGNOSIS — N179 Acute kidney failure, unspecified: Secondary | ICD-10-CM | POA: Diagnosis present

## 2018-05-15 DIAGNOSIS — N390 Urinary tract infection, site not specified: Secondary | ICD-10-CM | POA: Diagnosis present

## 2018-05-15 LAB — LACTIC ACID, PLASMA
Lactic Acid, Venous: 1.1 mmol/L (ref 0.5–1.9)
Lactic Acid, Venous: 1.1 mmol/L (ref 0.5–1.9)

## 2018-05-15 LAB — CBC
HCT: 43.3 % (ref 39.0–52.0)
Hemoglobin: 14.7 g/dL (ref 13.0–17.0)
MCH: 32.5 pg (ref 26.0–34.0)
MCHC: 33.9 g/dL (ref 30.0–36.0)
MCV: 95.6 fL (ref 80.0–100.0)
PLATELETS: 187 10*3/uL (ref 150–400)
RBC: 4.53 MIL/uL (ref 4.22–5.81)
RDW: 11.4 % — ABNORMAL LOW (ref 11.5–15.5)
WBC: 15.7 10*3/uL — AB (ref 4.0–10.5)
nRBC: 0 % (ref 0.0–0.2)

## 2018-05-15 LAB — BLOOD GAS, ARTERIAL
Acid-base deficit: 3.4 mmol/L — ABNORMAL HIGH (ref 0.0–2.0)
Bicarbonate: 20.6 mmol/L (ref 20.0–28.0)
Drawn by: 27407
FIO2: 21
O2 SAT: 97.2 %
Patient temperature: 98.6
pCO2 arterial: 33.5 mmHg (ref 32.0–48.0)
pH, Arterial: 7.405 (ref 7.350–7.450)
pO2, Arterial: 93.2 mmHg (ref 83.0–108.0)

## 2018-05-15 LAB — GLUCOSE, CAPILLARY
GLUCOSE-CAPILLARY: 153 mg/dL — AB (ref 70–99)
Glucose-Capillary: 125 mg/dL — ABNORMAL HIGH (ref 70–99)
Glucose-Capillary: 196 mg/dL — ABNORMAL HIGH (ref 70–99)
Glucose-Capillary: 121 mg/dL — ABNORMAL HIGH (ref 70–99)
Glucose-Capillary: 116 mg/dL — ABNORMAL HIGH (ref 70–99)
Glucose-Capillary: 160 mg/dL — ABNORMAL HIGH (ref 70–99)
Glucose-Capillary: 234 mg/dL — ABNORMAL HIGH (ref 70–99)
Glucose-Capillary: 140 mg/dL — ABNORMAL HIGH (ref 70–99)

## 2018-05-15 LAB — BASIC METABOLIC PANEL
Anion gap: 25 — ABNORMAL HIGH (ref 5–15)
Anion gap: 24 — ABNORMAL HIGH (ref 5–15)
Anion gap: 18 — ABNORMAL HIGH (ref 5–15)
Anion gap: 18 — ABNORMAL HIGH (ref 5–15)
BUN: 31 mg/dL — ABNORMAL HIGH (ref 6–20)
BUN: 32 mg/dL — ABNORMAL HIGH (ref 6–20)
BUN: 26 mg/dL — AB (ref 6–20)
BUN: 25 mg/dL — ABNORMAL HIGH (ref 6–20)
CALCIUM: 9 mg/dL (ref 8.9–10.3)
CALCIUM: 8.8 mg/dL — AB (ref 8.9–10.3)
CO2: 16 mmol/L — ABNORMAL LOW (ref 22–32)
CO2: 18 mmol/L — ABNORMAL LOW (ref 22–32)
CO2: 21 mmol/L — ABNORMAL LOW (ref 22–32)
CO2: 17 mmol/L — ABNORMAL LOW (ref 22–32)
Calcium: 9.1 mg/dL (ref 8.9–10.3)
Calcium: 8.8 mg/dL — ABNORMAL LOW (ref 8.9–10.3)
Chloride: 88 mmol/L — ABNORMAL LOW (ref 98–111)
Chloride: 87 mmol/L — ABNORMAL LOW (ref 98–111)
Chloride: 91 mmol/L — ABNORMAL LOW (ref 98–111)
Chloride: 95 mmol/L — ABNORMAL LOW (ref 98–111)
Creatinine, Ser: 2.61 mg/dL — ABNORMAL HIGH (ref 0.61–1.24)
Creatinine, Ser: 2.33 mg/dL — ABNORMAL HIGH (ref 0.61–1.24)
Creatinine, Ser: 1.91 mg/dL — ABNORMAL HIGH (ref 0.61–1.24)
Creatinine, Ser: 1.64 mg/dL — ABNORMAL HIGH (ref 0.61–1.24)
GFR calc Af Amer: 34 mL/min — ABNORMAL LOW (ref >60)
GFR calc Af Amer: 39 mL/min — ABNORMAL LOW (ref >60)
GFR calc Af Amer: 50 mL/min — ABNORMAL LOW (ref >60)
GFR calc Af Amer: 60 mL/min — ABNORMAL LOW (ref >60)
GFR calc non Af Amer: 29 mL/min — ABNORMAL LOW (ref >60)
GFR calc non Af Amer: 34 mL/min — ABNORMAL LOW (ref >60)
GFR calc non Af Amer: 43 mL/min — ABNORMAL LOW (ref >60)
GFR calc non Af Amer: 52 mL/min — ABNORMAL LOW (ref >60)
Glucose, Bld: 173 mg/dL — ABNORMAL HIGH (ref 70–99)
Glucose, Bld: 144 mg/dL — ABNORMAL HIGH (ref 70–99)
Glucose, Bld: 189 mg/dL — ABNORMAL HIGH (ref 70–99)
Glucose, Bld: 212 mg/dL — ABNORMAL HIGH (ref 70–99)
POTASSIUM: 5.6 mmol/L — AB (ref 3.5–5.1)
Potassium: 3.5 mmol/L (ref 3.5–5.1)
Potassium: 3.4 mmol/L — ABNORMAL LOW (ref 3.5–5.1)
Potassium: 3.4 mmol/L — ABNORMAL LOW (ref 3.5–5.1)
SODIUM: 129 mmol/L — AB (ref 135–145)
SODIUM: 129 mmol/L — AB (ref 135–145)
Sodium: 130 mmol/L — ABNORMAL LOW (ref 135–145)
Sodium: 130 mmol/L — ABNORMAL LOW (ref 135–145)

## 2018-05-15 LAB — HEMOGLOBIN A1C
HEMOGLOBIN A1C: 11 % — AB (ref 4.8–5.6)
Mean Plasma Glucose: 269 mg/dL

## 2018-05-15 LAB — CBG MONITORING, ED
GLUCOSE-CAPILLARY: 188 mg/dL — AB (ref 70–99)
Glucose-Capillary: 112 mg/dL — ABNORMAL HIGH (ref 70–99)
Glucose-Capillary: 154 mg/dL — ABNORMAL HIGH (ref 70–99)
Glucose-Capillary: 163 mg/dL — ABNORMAL HIGH (ref 70–99)
Glucose-Capillary: 213 mg/dL — ABNORMAL HIGH (ref 70–99)

## 2018-05-15 LAB — HIV ANTIBODY (ROUTINE TESTING W REFLEX): HIV SCREEN 4TH GENERATION: NONREACTIVE

## 2018-05-15 LAB — LIPASE, BLOOD: Lipase: 41 U/L (ref 11–51)

## 2018-05-15 MED ORDER — MAGNESIUM SULFATE IN D5W 1-5 GM/100ML-% IV SOLN
1.0000 g | Freq: Once | INTRAVENOUS | Status: AC
  Administered 2018-05-15: 1 g via INTRAVENOUS
  Filled 2018-05-15: qty 100

## 2018-05-15 MED ORDER — ALBUTEROL SULFATE (2.5 MG/3ML) 0.083% IN NEBU: 2.5000 mg | INHALATION_SOLUTION | Freq: Four times a day (QID) | RESPIRATORY_TRACT | Status: DC | PRN

## 2018-05-15 MED ORDER — POTASSIUM CHLORIDE 10 MEQ/100ML IV SOLN
10.0000 meq | INTRAVENOUS | Status: AC
  Administered 2018-05-15 (×2): 10 meq via INTRAVENOUS
  Filled 2018-05-15 (×2): qty 100

## 2018-05-15 MED ORDER — SODIUM CHLORIDE 0.9 % IV SOLN
1.0000 g | INTRAVENOUS | Status: DC
  Administered 2018-05-15 – 2018-05-16 (×2): 1 g via INTRAVENOUS
  Filled 2018-05-15 (×3): qty 10

## 2018-05-15 MED ORDER — ONDANSETRON HCL 4 MG/2ML IJ SOLN
4.0000 mg | Freq: Four times a day (QID) | INTRAMUSCULAR | Status: DC | PRN
  Administered 2018-05-15 (×2): 4 mg via INTRAVENOUS
  Filled 2018-05-15 (×2): qty 2

## 2018-05-15 MED ORDER — SODIUM CHLORIDE 0.9 % IV SOLN
INTRAVENOUS | Status: DC
  Administered 2018-05-15: 02:00:00 via INTRAVENOUS

## 2018-05-15 MED ORDER — PANTOPRAZOLE SODIUM 40 MG IV SOLR
40.0000 mg | Freq: Two times a day (BID) | INTRAVENOUS | Status: DC
  Administered 2018-05-15 – 2018-05-17 (×6): 40 mg via INTRAVENOUS
  Filled 2018-05-15 (×6): qty 40

## 2018-05-15 MED ORDER — ACETAMINOPHEN 325 MG PO TABS: 650.0000 mg | ORAL_TABLET | Freq: Four times a day (QID) | ORAL | Status: DC | PRN

## 2018-05-15 MED ORDER — INSULIN ASPART 100 UNIT/ML ~~LOC~~ SOLN: 0.0000 [IU] | Freq: Every day | SUBCUTANEOUS | Status: DC

## 2018-05-15 MED ORDER — LORAZEPAM 2 MG/ML IJ SOLN
0.0000 mg | Freq: Four times a day (QID) | INTRAMUSCULAR | Status: DC
  Administered 2018-05-15: 2 mg via INTRAVENOUS
  Filled 2018-05-15: qty 1

## 2018-05-15 MED ORDER — LORAZEPAM 2 MG/ML IJ SOLN: 0.0000 mg | Freq: Two times a day (BID) | INTRAMUSCULAR | Status: DC

## 2018-05-15 MED ORDER — DEXTROSE 50 % IV SOLN: 25.0000 mL | INTRAVENOUS | Status: DC | PRN

## 2018-05-15 MED ORDER — INSULIN REGULAR BOLUS VIA INFUSION
0.0000 [IU] | Freq: Three times a day (TID) | INTRAVENOUS | Status: DC
  Filled 2018-05-15: qty 10

## 2018-05-15 MED ORDER — FOLIC ACID 1 MG PO TABS
1.0000 mg | ORAL_TABLET | Freq: Every day | ORAL | Status: DC
  Administered 2018-05-15 – 2018-05-17 (×3): 1 mg via ORAL
  Filled 2018-05-15 (×3): qty 1

## 2018-05-15 MED ORDER — INSULIN REGULAR(HUMAN) IN NACL 100-0.9 UT/100ML-% IV SOLN: INTRAVENOUS | Status: DC

## 2018-05-15 MED ORDER — DEXTROSE-NACL 5-0.45 % IV SOLN
INTRAVENOUS | Status: DC
  Administered 2018-05-15 – 2018-05-17 (×7): via INTRAVENOUS

## 2018-05-15 MED ORDER — SODIUM CHLORIDE 0.9% FLUSH
3.0000 mL | Freq: Two times a day (BID) | INTRAVENOUS | Status: DC
  Administered 2018-05-15 – 2018-05-17 (×5): 3 mL via INTRAVENOUS

## 2018-05-15 MED ORDER — MORPHINE SULFATE (PF) 2 MG/ML IV SOLN
2.0000 mg | INTRAVENOUS | Status: DC | PRN
  Administered 2018-05-15: 2 mg via INTRAVENOUS
  Filled 2018-05-15: qty 1

## 2018-05-15 MED ORDER — ACETAMINOPHEN 650 MG RE SUPP: 650.0000 mg | Freq: Four times a day (QID) | RECTAL | Status: DC | PRN

## 2018-05-15 MED ORDER — ADULT MULTIVITAMIN W/MINERALS CH
1.0000 | ORAL_TABLET | Freq: Every day | ORAL | Status: DC
  Administered 2018-05-15 – 2018-05-17 (×3): 1 via ORAL
  Filled 2018-05-15 (×3): qty 1

## 2018-05-15 MED ORDER — ONDANSETRON HCL 4 MG PO TABS: 4.0000 mg | ORAL_TABLET | Freq: Four times a day (QID) | ORAL | Status: DC | PRN

## 2018-05-15 MED ORDER — LORAZEPAM 2 MG/ML IJ SOLN: 1.0000 mg | Freq: Four times a day (QID) | INTRAMUSCULAR | Status: DC | PRN

## 2018-05-15 MED ORDER — POTASSIUM CHLORIDE 10 MEQ/100ML IV SOLN
10.0000 meq | INTRAVENOUS | Status: AC
  Administered 2018-05-15 (×4): 10 meq via INTRAVENOUS
  Filled 2018-05-15 (×4): qty 100

## 2018-05-15 MED ORDER — LORAZEPAM 1 MG PO TABS: 1.0000 mg | ORAL_TABLET | Freq: Four times a day (QID) | ORAL | Status: DC | PRN

## 2018-05-15 MED ORDER — HEPARIN SODIUM (PORCINE) 5000 UNIT/ML IJ SOLN
5000.0000 [IU] | Freq: Three times a day (TID) | INTRAMUSCULAR | Status: DC
  Administered 2018-05-15 – 2018-05-17 (×8): 5000 [IU] via SUBCUTANEOUS
  Filled 2018-05-15 (×8): qty 1

## 2018-05-15 MED ORDER — GABAPENTIN 300 MG PO CAPS
300.0000 mg | ORAL_CAPSULE | Freq: Every day | ORAL | Status: DC
  Administered 2018-05-15 – 2018-05-16 (×2): 300 mg via ORAL
  Filled 2018-05-15 (×2): qty 1

## 2018-05-15 MED ORDER — VITAMIN B-1 100 MG PO TABS
100.0000 mg | ORAL_TABLET | Freq: Every day | ORAL | Status: DC
  Administered 2018-05-15 – 2018-05-17 (×3): 100 mg via ORAL
  Filled 2018-05-15 (×3): qty 1

## 2018-05-15 MED ORDER — INSULIN ASPART 100 UNIT/ML ~~LOC~~ SOLN
0.0000 [IU] | SUBCUTANEOUS | Status: DC
  Administered 2018-05-15: 2 [IU] via SUBCUTANEOUS
  Administered 2018-05-15: 5 [IU] via SUBCUTANEOUS
  Administered 2018-05-16: 3 [IU] via SUBCUTANEOUS
  Administered 2018-05-16 (×2): 5 [IU] via SUBCUTANEOUS
  Administered 2018-05-16: 3 [IU] via SUBCUTANEOUS
  Administered 2018-05-16: 2 [IU] via SUBCUTANEOUS
  Administered 2018-05-16 – 2018-05-17 (×2): 3 [IU] via SUBCUTANEOUS
  Administered 2018-05-17: 5 [IU] via SUBCUTANEOUS
  Administered 2018-05-17: 2 [IU] via SUBCUTANEOUS
  Administered 2018-05-17: 5 [IU] via SUBCUTANEOUS

## 2018-05-15 MED ORDER — INSULIN GLARGINE 100 UNIT/ML ~~LOC~~ SOLN
15.0000 [IU] | Freq: Every day | SUBCUTANEOUS | Status: DC
  Administered 2018-05-15 – 2018-05-16 (×2): 15 [IU] via SUBCUTANEOUS
  Filled 2018-05-15 (×2): qty 0.15

## 2018-05-15 MED ORDER — THIAMINE HCL 100 MG/ML IJ SOLN
100.0000 mg | Freq: Every day | INTRAMUSCULAR | Status: DC
  Filled 2018-05-15 (×2): qty 2

## 2018-05-15 NOTE — Progress Notes (Signed)
Pt's glucose 116. Glucometer not transferring data into Epic.

## 2018-05-15 NOTE — Progress Notes (Signed)
PROGRESS NOTE    [facility] [place]  WNU:[id number] DOB: Jan 13, 1976 DOA: 05/14/2018 PCP: Patient, No Pcp Per   Brief Narrative:  43 year old male with history of diabetes mellitus type 2 and alcohol use came to the hospital with complains of intermittent right upper quadrant and lower quadrant pain.  Upon admission patient was noted to have significant hyperglycemia with anion gap suggestive of diabetic ketoacidosis, mild acute pancreatitis, urinary tract infection and slightly dilated appendix.   Assessment & Plan:   Principal Problem:   DKA (diabetic ketoacidoses) (HCC) Active Problems:   Hyponatremia   Acute renal failure (ARF) (HCC)   Acute lower UTI   Hypokalemia   Alcohol abuse  Diabetic ketoacidosis, slowly improved Uncontrolled diabetes mellitus type 2, insulin-dependent -Hemoglobin A1c is 11.0 -Anion gap is almost close this morning.  We will transition patient to subcutaneous insulin, stop insulin drip in 2 hours.  Nurses have been informed how to make this transition - Resume oral diet, clear liquid diet due to other intra-abdominal pathology. - Continue Accu-Cheks and sliding scale -Diabetic educator has been consulted  Acute kidney injury; likely prerenal -Baseline creatinine is 0.7.  His creatinine at the time of presentation was 2.7.  With IV fluids this is trended down to 1.9.  Continue avoid nephrotoxic drugs and adjust any necessary medications renally  Urinary tract infection without hematuria - Urine cultures-pending -Empirically continue Rocephin  Acute early pancreatitis - Diet as tolerated.  IV fluids. -Right upper quadrant ultrasound- normal gallbladder, hepatic steatosis  Dilated appendix suggestive of possible appendicitis -Appreciate general surgery input.  No surgical needs at this time.  Will conservatively manage this  History of alcohol use -Currently on alcohol withdrawal protocol.  Hyponatremia -Suspect from hyperglycemia causing  pseudohyponatremia and also dehydration.  DVT prophylaxis: Heparin Code Status: Full code Family Communication: Spoke with the patient's wife over the phone Disposition Plan: Maintain inpatient stay until multiple issues as stated above have started improving.  He will at least be in the hospital for next 24-48 hours if not more.  Consultants:   General surgery  Procedures:   None  Antimicrobials:   None   Subjective: Patient feels slightly better but still reports of epigastric and right lower quadrant abdominal pain.  He has not tried anything orally yet.  Review of Systems Otherwise negative except as per HPI, including: General: Denies fever, chills, night sweats or unintended weight loss. Resp: Denies cough, wheezing, shortness of breath. Cardiac: Denies chest pain, palpitations, orthopnea, paroxysmal nocturnal dyspnea. GI: Denies vomiting, diarrhea or constipation GU: Denies dysuria, frequency, hesitancy or incontinence MS: Denies muscle aches, joint pain or swelling Neuro: Denies headache, neurologic deficits (focal weakness, numbness, tingling), abnormal gait Psych: Denies anxiety, depression, SI/HI/AVH Skin: Denies new rashes or lesions ID: Denies sick contacts, exotic exposures, travel  Objective: Vitals:   05/15/18 0430 05/15/18 0615 05/15/18 0913 05/15/18 1103  BP: 103/60 105/63 (!) 129/91   Pulse: (!) 104 (!) 107 (!) 116 (!) (P) 116  Resp: (!) 24  14 17   Temp:  98.5 F (36.9 C) 97.7 F (36.5 C)   TempSrc:  Oral Oral   SpO2: 96% 98% 100%   Weight:  70 kg    Height:  5\' 4"  (1.626 m)      Intake/Output Summary (Last 24 hours) at 05/15/2018 1106 Last data filed at 05/14/2018 2313 Gross per 24 hour  Intake 189.01 ml  Output -  Net 189.01 ml   Filed Weights   05/15/18 0615  Weight: 70  kg    Examination:  General exam: Appears calm and comfortable  Respiratory system: Clear to auscultation. Respiratory effort dry Cardiovascular system: S1 & S2  heard, RRR. No JVD, murmurs, rubs, gallops or clicks. No pedal edema. Gastrointestinal system: Abdomen is nondistended, soft and nontender. No organomegaly or masses felt. Normal bowel sounds heard. Central nervous system: Alert and oriented. No focal neurological deficits. Extremities: Symmetric 5 x 5 power. Skin: No rashes, lesions or ulcers Psychiatry: Judgement and insight appear normal. Mood & affect appropriate.     Data Reviewed:   CBC: Recent Labs  Lab 05/10/18 1302 05/14/18 1703 05/15/18 0318  WBC  --  16.5* 15.7*  HGB 17.0 16.0 14.7  HCT 50.0 45.7 43.3  MCV  --  95.8 95.6  PLT  --  227 187   Basic Metabolic Panel: Recent Labs  Lab 05/10/18 1302 05/14/18 1703 05/15/18 0226 05/15/18 0318 05/15/18 0851  NA 136 126* 129* 129* 130*  K 4.1 3.2* 3.5 3.4* 3.4*  CL 96* 81* 88* 87* 91*  CO2  --  14* 16* 18* 21*  GLUCOSE 300* 360* 173* 144* 189*  BUN <3* 21* 31* 32* 26*  CREATININE 0.70 2.77* 2.61* 2.33* 1.91*  CALCIUM  --  9.7 9.1 9.0 8.8*   GFR: Estimated Creatinine Clearance: 43 mL/min (A) (by C-G formula based on SCr of 1.91 mg/dL (H)). Liver Function Tests: Recent Labs  Lab 05/14/18 1703  AST 40  ALT 34  ALKPHOS 63  BILITOT 2.2*  PROT 8.6*  ALBUMIN 4.7   Recent Labs  Lab 05/14/18 1703 05/15/18 0318  LIPASE 26 41   No results for input(s): AMMONIA in the last 168 hours. Coagulation Profile: No results for input(s): INR, PROTIME in the last 168 hours. Cardiac Enzymes: No results for input(s): CKTOTAL, CKMB, CKMBINDEX, TROPONINI in the last 168 hours. BNP (last 3 results) No results for input(s): PROBNP in the last 8760 hours. HbA1C: Recent Labs    05/15/18 0318  HGBA1C 11.0*   CBG: Recent Labs  Lab 05/15/18 0521 05/15/18 0633 05/15/18 0804 05/15/18 0910 05/15/18 1035  GLUCAP 112* 125* 153* 196* 121*   Lipid Profile: No results for input(s): CHOL, HDL, LDLCALC, TRIG, CHOLHDL, LDLDIRECT in the last 72 hours. Thyroid Function  Tests: No results for input(s): TSH, T4TOTAL, FREET4, T3FREE, THYROIDAB in the last 72 hours. Anemia Panel: No results for input(s): VITAMINB12, FOLATE, FERRITIN, TIBC, IRON, RETICCTPCT in the last 72 hours. Sepsis Labs: Recent Labs  Lab 05/15/18 0851  LATICACIDVEN 1.1    No results found for this or any previous visit (from the past 240 hour(s)).       Radiology Studies: Ct Abdomen Pelvis Wo Contrast  Result Date: 05/14/2018 CLINICAL DATA:  Acute abdominal pain, nausea, vomiting and diarrhea for 3 days. EXAM: CT ABDOMEN AND PELVIS WITHOUT CONTRAST TECHNIQUE: Multidetector CT imaging of the abdomen and pelvis was performed following the standard protocol without IV contrast. COMPARISON:  12/15/2016 right upper quadrant abdominal sonogram. FINDINGS: Lower chest: No significant pulmonary nodules or acute consolidative airspace disease. Circumferential wall thickening in the lower thoracic esophagus. Hepatobiliary: Diffuse hepatic steatosis. No definite liver surface irregularity. No liver mass. Normal liver size. Normal gallbladder with no radiopaque cholelithiasis. No biliary ductal dilatation. Pancreas: Mild haziness of the peripancreatic fat surrounding the pancreatic head and neck, suggesting mild acute pancreatitis. No pancreatic mass or duct dilation. No peripancreatic fluid collections. Spleen: Normal size. No mass. Adrenals/Urinary Tract: Normal adrenals. No hydronephrosis. No renal stones. No contour deforming  renal masses. Normal nondistended bladder. Stomach/Bowel: Normal non-distended stomach. Normal caliber small bowel with no small bowel wall thickening. Appendix is dilated (12 mm diameter) and fluid-filled with no definite appendiceal wall thickening or appreciable periappendiceal fat stranding. Normal large bowel with no diverticulosis, large bowel wall thickening or pericolonic fat stranding. Vascular/Lymphatic: Mildly atherosclerotic nonaneurysmal abdominal aorta. No  pathologically enlarged lymph nodes in the abdomen or pelvis. Reproductive: Normal size prostate. Other: No pneumoperitoneum, ascites or focal fluid collection. Musculoskeletal: No aggressive appearing focal osseous lesions. Mild lumbar spondylosis. IMPRESSION: 1. Mild haziness of the peripancreatic fat surrounding the pancreatic head and neck, which may indicate mild acute pancreatitis. No peripancreatic fluid collections. 2. Appendix is dilated (12 mm diameter) and fluid-filled with no definite appendiceal wall thickening or appreciable periappendiceal inflammatory changes. Findings could indicate an appendiceal mucocele, with acute appendicitis not excluded. Surgical consultation may be considered. Short-term follow-up CT abdomen/pelvis with oral and IV contrast may be considered. 3. Circumferential wall thickening in the lower thoracic esophagus, nonspecific, most commonly due to reflux esophagitis, with Barrett's esophagus or neoplasm not excluded by imaging. Further evaluation should be based on clinical assessment. 4. Diffuse hepatic steatosis. 5.  Aortic Atherosclerosis (ICD10-I70.0). Electronically Signed   By: Delbert Phenix M.D.   On: 05/14/2018 21:40   US Abdomen Limited Ruq  Result Date: 05/15/2018 CLINICAL DATA:  Right upper quadrant pain for 3 days EXAM: ULTRASOUND ABDOMEN LIMITED RIGHT UPPER QUADRANT COMPARISON:  None. FINDINGS: Gallbladder: No gallstones or wall thickening visualized. No sonographic Murphy sign noted by sonographer. Common bile duct: Diameter: 5 mm Liver: Hyperechoic liver parenchyma. Portal vein is patent on color Doppler imaging with normal direction of blood flow towards the liver. IMPRESSION: 1. Normal gallbladder. 2. Hepatic steatosis. Electronically Signed   By: Deatra Robinson M.D.   On: 05/15/2018 00:18        Scheduled Meds: . folic acid  1 mg Oral Daily  . gabapentin  300 mg Oral QHS  . heparin  5,000 Units Subcutaneous Q8H  . insulin aspart  0-15 Units  Subcutaneous Q4H  . insulin aspart  0-5 Units Subcutaneous QHS  . insulin glargine  15 Units Subcutaneous Daily  . insulin regular  0-10 Units Intravenous TID WC  . LORazepam  0-4 mg Intravenous Q6H   Followed by  . [START ON 05/17/2018] LORazepam  0-4 mg Intravenous Q12H  . multivitamin with minerals  1 tablet Oral Daily  . pantoprazole (PROTONIX) IV  40 mg Intravenous Q12H  . sodium chloride flush  3 mL Intravenous Q12H  . thiamine  100 mg Oral Daily   Or  . thiamine  100 mg Intravenous Daily   Continuous Infusions: . sodium chloride Stopped (05/15/18 0800)  . cefTRIAXone (ROCEPHIN)  IV    . dextrose 5 % and 0.45% NaCl 100 mL/hr at 05/15/18 1101  . insulin 4.1 Units/hr (05/15/18 0911)  . magnesium sulfate 1 - 4 g bolus IVPB    . potassium chloride 10 mEq (05/15/18 1034)     LOS: 0 days   Time spent= 50 mins    Aubriauna Riner Joline Maxcy, MD [facility] Pager [phone]   If 7PM-7AM, please contact night-coverage [email or web address] Password [email or web address] 05/15/2018, 11:06 AM

## 2018-05-15 NOTE — Progress Notes (Signed)
Assessment & Plan: HD#2 Abdominal pain  CT abd essentially negative for acute process  USN - normal gallbladder, hepatic steatosis  Serial exams - benign  Will order clear liquid diet - may advance as tolerated  WBC still 15.7 - ?source  Will follow DKA  Per medical service        Darnell Level, MD       [facility]       Office: [phone]   Chief Complaint: Abdominal pain  Subjective: Patient in bed, somnolent, nods yes and no to questions in [place] and [place].  Denies abdominal pain.  Objective: Vital signs in last 24 hours: Temp:  [98 F (36.7 C)-98.5 F (36.9 C)] 98.5 F (36.9 C) (01/01 0615) Pulse Rate:  [97-119] 107 (01/01 0615) Resp:  [15-27] 24 (01/01 0430) BP: (86-114)/(59-76) 105/63 (01/01 0615) SpO2:  [96 %-100 %] 98 % (01/01 0615) Weight:  [70 kg] 70 kg (01/01 0615) Last BM Date: 05/14/18  Intake/Output from previous day: 12/31 0701 - 01/01 0700 In: 189 [IV Piggyback:189] Out: -  Intake/Output this shift: No intake/output data recorded.  Physical Exam: HEENT - sclerae clear, mucous membranes moist Neck - soft Chest - clear bilaterally Cor - RRR Abdomen - soft without distension; non-tender; no mass Ext - no edema, non-tender  Lab Results:  Recent Labs    05/14/18 1703 05/15/18 0318  WBC 16.5* 15.7*  HGB 16.0 14.7  HCT 45.7 43.3  PLT 227 187   BMET Recent Labs    05/15/18 0226 05/15/18 0318  NA 129* 129*  K 3.5 3.4*  CL 88* 87*  CO2 16* 18*  GLUCOSE 173* 144*  BUN 31* 32*  CREATININE 2.61* 2.33*  CALCIUM 9.1 9.0   PT/INR No results for input(s): LABPROT, INR in the last 72 hours. Comprehensive Metabolic Panel:    Component Value Date/Time   NA 129 (L) 05/15/2018 0318   NA 129 (L) 05/15/2018 0226   NA 137 01/31/2017 1616   K 3.4 (L) 05/15/2018 0318   K 3.5 05/15/2018 0226   CL 87 (L) 05/15/2018 0318   CL 88 (L) 05/15/2018 0226   CO2 18 (L) 05/15/2018 0318   CO2 16 (L) 05/15/2018 0226   BUN  32 (H) 05/15/2018 0318   BUN 31 (H) 05/15/2018 0226   BUN 8 01/31/2017 1616   CREATININE 2.33 (H) 05/15/2018 0318   CREATININE 2.61 (H) 05/15/2018 0226   GLUCOSE 144 (H) 05/15/2018 0318   GLUCOSE 173 (H) 05/15/2018 0226   CALCIUM 9.0 05/15/2018 0318   CALCIUM 9.1 05/15/2018 0226   AST 40 05/14/2018 1703   AST 19 01/31/2017 1616   ALT 34 05/14/2018 1703   ALT 19 01/31/2017 1616   ALKPHOS 63 05/14/2018 1703   ALKPHOS 85 01/31/2017 1616   BILITOT 2.2 (H) 05/14/2018 1703   BILITOT 0.4 01/31/2017 1616   BILITOT 0.7 12/16/2016 0751   PROT 8.6 (H) 05/14/2018 1703   PROT 7.5 01/31/2017 1616   PROT 6.3 (L) 12/16/2016 0751   ALBUMIN 4.7 05/14/2018 1703   ALBUMIN 4.6 01/31/2017 1616   ALBUMIN 3.1 (L) 12/16/2016 0751    Studies/Results: Ct Abdomen Pelvis Wo Contrast  Result Date: 05/14/2018 CLINICAL DATA:  Acute abdominal pain, nausea, vomiting and diarrhea for 3 days. EXAM: CT ABDOMEN AND PELVIS WITHOUT CONTRAST TECHNIQUE: Multidetector CT imaging of the abdomen and pelvis was performed following the standard protocol without IV contrast. COMPARISON:  12/15/2016 right upper quadrant abdominal sonogram. FINDINGS: Lower  chest: No significant pulmonary nodules or acute consolidative airspace disease. Circumferential wall thickening in the lower thoracic esophagus. Hepatobiliary: Diffuse hepatic steatosis. No definite liver surface irregularity. No liver mass. Normal liver size. Normal gallbladder with no radiopaque cholelithiasis. No biliary ductal dilatation. Pancreas: Mild haziness of the peripancreatic fat surrounding the pancreatic head and neck, suggesting mild acute pancreatitis. No pancreatic mass or duct dilation. No peripancreatic fluid collections. Spleen: Normal size. No mass. Adrenals/Urinary Tract: Normal adrenals. No hydronephrosis. No renal stones. No contour deforming renal masses. Normal nondistended bladder. Stomach/Bowel: Normal non-distended stomach. Normal caliber small bowel  with no small bowel wall thickening. Appendix is dilated (12 mm diameter) and fluid-filled with no definite appendiceal wall thickening or appreciable periappendiceal fat stranding. Normal large bowel with no diverticulosis, large bowel wall thickening or pericolonic fat stranding. Vascular/Lymphatic: Mildly atherosclerotic nonaneurysmal abdominal aorta. No pathologically enlarged lymph nodes in the abdomen or pelvis. Reproductive: Normal size prostate. Other: No pneumoperitoneum, ascites or focal fluid collection. Musculoskeletal: No aggressive appearing focal osseous lesions. Mild lumbar spondylosis. IMPRESSION: 1. Mild haziness of the peripancreatic fat surrounding the pancreatic head and neck, which may indicate mild acute pancreatitis. No peripancreatic fluid collections. 2. Appendix is dilated (12 mm diameter) and fluid-filled with no definite appendiceal wall thickening or appreciable periappendiceal inflammatory changes. Findings could indicate an appendiceal mucocele, with acute appendicitis not excluded. Surgical consultation may be considered. Short-term follow-up CT abdomen/pelvis with oral and IV contrast may be considered. 3. Circumferential wall thickening in the lower thoracic esophagus, nonspecific, most commonly due to reflux esophagitis, with Barrett's esophagus or neoplasm not excluded by imaging. Further evaluation should be based on clinical assessment. 4. Diffuse hepatic steatosis. 5.  Aortic Atherosclerosis (ICD10-I70.0). Electronically Signed   By: Delbert Phenix M.D.   On: 05/14/2018 21:40   US Abdomen Limited Ruq  Result Date: 05/15/2018 CLINICAL DATA:  Right upper quadrant pain for 3 days EXAM: ULTRASOUND ABDOMEN LIMITED RIGHT UPPER QUADRANT COMPARISON:  None. FINDINGS: Gallbladder: No gallstones or wall thickening visualized. No sonographic Murphy sign noted by sonographer. Common bile duct: Diameter: 5 mm Liver: Hyperechoic liver parenchyma. Portal vein is patent on color Doppler  imaging with normal direction of blood flow towards the liver. IMPRESSION: 1. Normal gallbladder. 2. Hepatic steatosis. Electronically Signed   By: Deatra Robinson M.D.   On: 05/15/2018 00:18      Mala Gibbard M 05/15/2018  Patient ID: [facility] [place], male   DOB: 02-Apr-1976, 43 y.o.   MRN: [id number]

## 2018-05-15 NOTE — Progress Notes (Signed)
Pt's glucose 160. Glucometer still not transferring data into Epic.

## 2018-05-16 DIAGNOSIS — E119 Type 2 diabetes mellitus without complications: Secondary | ICD-10-CM

## 2018-05-16 DIAGNOSIS — Z794 Long term (current) use of insulin: Secondary | ICD-10-CM

## 2018-05-16 LAB — GLUCOSE, CAPILLARY
GLUCOSE-CAPILLARY: 221 mg/dL — AB (ref 70–99)
Glucose-Capillary: 127 mg/dL — ABNORMAL HIGH (ref 70–99)
Glucose-Capillary: 151 mg/dL — ABNORMAL HIGH (ref 70–99)
Glucose-Capillary: 158 mg/dL — ABNORMAL HIGH (ref 70–99)
Glucose-Capillary: 165 mg/dL — ABNORMAL HIGH (ref 70–99)
Glucose-Capillary: 226 mg/dL — ABNORMAL HIGH (ref 70–99)

## 2018-05-16 LAB — BASIC METABOLIC PANEL
Anion gap: 12 (ref 5–15)
BUN: 11 mg/dL (ref 6–20)
CO2: 25 mmol/L (ref 22–32)
Calcium: 9.3 mg/dL (ref 8.9–10.3)
Chloride: 99 mmol/L (ref 98–111)
Creatinine, Ser: 1.23 mg/dL (ref 0.61–1.24)
GFR calc Af Amer: >60 mL/min (ref >60)
GFR calc non Af Amer: >60 mL/min (ref >60)
Glucose, Bld: 130 mg/dL — ABNORMAL HIGH (ref 70–99)
Potassium: 3 mmol/L — ABNORMAL LOW (ref 3.5–5.1)
Sodium: 136 mmol/L (ref 135–145)

## 2018-05-16 LAB — CBC
HCT: 40.6 % (ref 39.0–52.0)
HEMOGLOBIN: 14.5 g/dL (ref 13.0–17.0)
MCH: 33.3 pg (ref 26.0–34.0)
MCHC: 35.7 g/dL (ref 30.0–36.0)
MCV: 93.3 fL (ref 80.0–100.0)
Platelets: 172 10*3/uL (ref 150–400)
RBC: 4.35 MIL/uL (ref 4.22–5.81)
RDW: 11.1 % — ABNORMAL LOW (ref 11.5–15.5)
WBC: 8.4 10*3/uL (ref 4.0–10.5)
nRBC: 0 % (ref 0.0–0.2)

## 2018-05-16 LAB — MAGNESIUM: Magnesium: 2.2 mg/dL (ref 1.7–2.4)

## 2018-05-16 LAB — URINE CULTURE: Culture: >=100000 — AB

## 2018-05-16 MED ORDER — POTASSIUM CHLORIDE CRYS ER 20 MEQ PO TBCR
30.0000 meq | EXTENDED_RELEASE_TABLET | ORAL | Status: AC
  Administered 2018-05-16 (×2): 30 meq via ORAL
  Filled 2018-05-16 (×2): qty 1

## 2018-05-16 MED ORDER — ALUM & MAG HYDROXIDE-SIMETH 200-200-20 MG/5ML PO SUSP
30.0000 mL | ORAL | Status: DC | PRN
  Administered 2018-05-17 (×2): 30 mL via ORAL
  Filled 2018-05-16 (×2): qty 30

## 2018-05-16 MED ORDER — INSULIN STARTER KIT- PEN NEEDLES (ENGLISH)
1.0000 | Freq: Once | Status: DC
  Filled 2018-05-16: qty 1

## 2018-05-16 MED ORDER — INSULIN GLARGINE 100 UNIT/ML ~~LOC~~ SOLN
20.0000 [IU] | Freq: Every day | SUBCUTANEOUS | Status: DC
  Administered 2018-05-17: 20 [IU] via SUBCUTANEOUS
  Filled 2018-05-16: qty 0.2

## 2018-05-16 NOTE — Progress Notes (Signed)
CC: 4 to 5 days of abdominal pain, nausea and vomiting  Subjective: He is complaining of some right upper quadrant pain.  He also has indigestion which has not resolved.  Abdomen is soft, positive bowel sounds.  Objective: Vital signs in last 24 hours: Temp:  [97.5 F (36.4 C)-98.6 F (37 C)] 98.4 F (36.9 C) (01/02 0811) Pulse Rate:  [91-132] 94 (01/02 0811) Resp:  [14-27] 14 (01/02 0811) BP: (129-154)/(89-111) 153/104 (01/02 0811) SpO2:  [98 %-100 %] 99 % (01/02 0811) Weight:  [69.6 kg] 69.6 kg (01/02 0408) Last BM Date: 05/15/18 2945 IV Urine x1 recorded Afebrile tachycardia is improving, blood pressure is elevated last blood pressure 153/104 O2 sats good on room air Potassium was 3.0, glucose 130, creatinine 1.23 WBC 8.4, hemoglobin 14.5, hematocrit 40.6.  Platelets 172,000. Admission CT 1231: Mild haziness of the peripancreatic fat surrounding the pancreatic head and neck questionable pancreatitis, dilated appendix fluid-filled with no definite appendiceal wall thickening or periappendiceal inflammatory changes.  Circumferential wall thickening in the lower thoracic esophagus nonspecific most commonly due to reflux esophagitis.  Neoplasm/Barrett's esophagus not excluded. Diffuse hepatic steatosis Intake/Output from previous day: 01/01 0701 - 01/02 0700 In: 2945.5 [I.V.:2445.5; IV Piggyback:500] Out: -  Intake/Output this shift: No intake/output data recorded.  General appearance: alert, cooperative and no distress Resp: clear to auscultation bilaterally GI: Soft nontender he complains of some discomfort in the right upper quadrant.  He has pretty bad heartburn indigestion, already on a PPI.  Lab Results:  Recent Labs    05/15/18 0318 05/16/18 0223  WBC 15.7* 8.4  HGB 14.7 14.5  HCT 43.3 40.6  PLT 187 172    BMET Recent Labs    05/15/18 1455 05/16/18 0223  NA 130* 136  K 5.6* 3.0*  CL 95* 99  CO2 17* 25  GLUCOSE 212* 130*  BUN 25* 11  CREATININE  1.64* 1.23  CALCIUM 8.8* 9.3   PT/INR No results for input(s): LABPROT, INR in the last 72 hours.  Recent Labs  Lab 05/14/18 1703  AST 40  ALT 34  ALKPHOS 63  BILITOT 2.2*  PROT 8.6*  ALBUMIN 4.7     Lipase     Component Value Date/Time   LIPASE 41 05/15/2018 0318     Medications: . folic acid  1 mg Oral Daily  . gabapentin  300 mg Oral QHS  . heparin  5,000 Units Subcutaneous Q8H  . insulin aspart  0-15 Units Subcutaneous Q4H  . insulin aspart  0-5 Units Subcutaneous QHS  . insulin glargine  15 Units Subcutaneous Daily  . LORazepam  0-4 mg Intravenous Q6H   Followed by  . [START ON 05/17/2018] LORazepam  0-4 mg Intravenous Q12H  . multivitamin with minerals  1 tablet Oral Daily  . pantoprazole (PROTONIX) IV  40 mg Intravenous Q12H  . potassium chloride  30 mEq Oral Q4H  . sodium chloride flush  3 mL Intravenous Q12H  . thiamine  100 mg Oral Daily   Or  . thiamine  100 mg Intravenous Daily   . sodium chloride Stopped (05/15/18 0800)  . cefTRIAXone (ROCEPHIN)  IV 1 g (05/15/18 2150)  . dextrose 5 % and 0.45% NaCl 100 mL/hr at 05/16/18 3953   Assessment/Plan   Abdominal pain             CT abd essentially negative for acute process             USN - normal gallbladder, hepatic steatosis  Serial exams - benign             Will order clear liquid diet - may advance as tolerated             WBC still 15.7 - ?source             Will follow DKA             Per medical service  FEN: Fluids/heart healthy carb modified diet ID: Rocephin 12/31-05/15/18 DVT:  Heparin  Plan: Plan to give him some Maalox, see if this helps.  He does not appear to have it an acute abdominal process on exam.      LOS: 1 day    Nickalaus Crooke 05/16/2018 [phone]

## 2018-05-16 NOTE — Progress Notes (Addendum)
[facility] per clinic pt has not been seen there in over a year and is no longer considered a patient there. Requested Hospital f/u appointment however there are currently no f/u appointment available, CM called [facility] and f/u appointment was available for Jan. 28 at 10:10 am- with this being a sister [facility] is eligible to use [facility] with this appointment in place. - CM will follow for further transition of care needs and possible medication assistance.

## 2018-05-16 NOTE — Progress Notes (Signed)
PROGRESS NOTE    [facility]  WUJ:[id number] DOB: 11/28/1975 DOA: 05/14/2018 PCP: Patient, No Pcp Per   Brief Narrative:  43 year old male with history of diabetes mellitus type 2 and alcohol use came to the hospital with complains of intermittent right upper quadrant and lower quadrant pain.  Upon admission patient was noted to have significant hyperglycemia with anion gap suggestive of diabetic ketoacidosis, mild acute pancreatitis, urinary tract infection and slightly dilated appendix.   Assessment & Plan:   Principal Problem:   DKA (diabetic ketoacidoses) (HCC) Active Problems:   Hyponatremia   Acute renal failure (ARF) (HCC)   Acute lower UTI   Hypokalemia   Alcohol abuse  Diabetic ketoacidosis, resolved Uncontrolled diabetes mellitus type 2, insulin-dependent -Hemoglobin A1c is 11.0 -Anion gap closed.  Transition to subcutaneous insulin.  Increase Lantus from 15 units to 20 units daily. - Resume oral diet, clear liquid diet due to other intra-abdominal pathology. - Continue Accu-Cheks and sliding scale -Diabetic educator has been consulted-pending  Acute kidney injury; likely prerenal -Baseline creatinine is 0.7.  His creatinine at the time of presentation was 2.7.  With IV fluids this is trended down to 1.3.  Continue avoid nephrotoxic drugs and adjust any necessary medications renally  Urinary tract infection without hematuria - Urine cultures- growing unidentified organism greater than 100,000 colonies -Empirically continue Rocephin  Acute early pancreatitis - Diet as tolerated.  IV fluids. -Right upper quadrant ultrasound- normal gallbladder, hepatic steatosis  Dilated appendix suggestive of possible appendicitis -Appreciate general surgery input.  No surgical needs at this time.  Will conservatively manage this  Hypokalemia -Potassium 3.0, replacement ordered  History of alcohol use -Currently on alcohol withdrawal protocol.  Hyponatremia;  resolved -Sodium is 136 suspect from hyperglycemia causing pseudohyponatremia and also dehydration.  DVT prophylaxis: Heparin Code Status: Full code Family Communication: Spoke with the patient's wife over the phone Disposition Plan: Maintain inpatient stay for multiple issues above.  Also need to advance his diet slowly as tolerated.  Hopefully we can discharge him in next 24-48 hours  Consultants:   General surgery  Procedures:   None  Antimicrobials:   None   Subjective: Patient feels slightly better than yesterday but still having abdominal discomfort.  Tolerated oral diet yesterday and would like to advance it today.  Review of Systems Otherwise negative except as per HPI, including: General = no fevers, chills, dizziness, malaise, fatigue HEENT/EYES = negative for pain, redness, loss of vision, double vision, blurred vision, loss of hearing, sore throat, hoarseness, dysphagia Cardiovascular= negative for chest pain, palpitation, murmurs, lower extremity swelling Respiratory/lungs= negative for shortness of breath, cough, hemoptysis, wheezing, mucus production Gastrointestinal= negative for nausea, vomiting,, abdominal pain, melena, hematemesis Genitourinary= negative for Dysuria, Hematuria, Change in Urinary Frequency MSK = Negative for arthralgia, myalgias, Back Pain, Joint swelling  Neurology= Negative for headache, seizures, numbness, tingling  Psychiatry= Negative for anxiety, depression, suicidal and homocidal ideation Allergy/Immunology= Medication/Food allergy as listed  Skin= Negative for Rash, lesions, ulcers, itching   Objective: Vitals:   05/15/18 2005 05/16/18 0025 05/16/18 0408 05/16/18 0811  BP: 129/89 (!) 142/97 (!) 154/111 (!) 153/104  Pulse: (!) 132 93 91 94  Resp: 17 (!) 27 17 14   Temp: 98.6 F (37 C) 97.8 F (36.6 C) 98.4 F (36.9 C) 98.4 F (36.9 C)  TempSrc: Oral Oral Oral Oral  SpO2: 98% 99% 99% 99%  Weight:   69.6 kg   Height:         Intake/Output Summary (Last  24 hours) at 05/16/2018 1040 Last data filed at 05/15/2018 2150 Gross per 24 hour  Intake 2945.52 ml  Output -  Net 2945.52 ml   Filed Weights   05/15/18 0615 05/16/18 0408  Weight: 70 kg 69.6 kg    Examination:  Constitutional: NAD, calm, comfortable Eyes: PERRL, lids and conjunctivae normal ENMT: Mucous membranes are moist. Posterior pharynx clear of any exudate or lesions.Normal dentition.  Neck: normal, supple, no masses, no thyromegaly Respiratory: clear to auscultation bilaterally, no wheezing, no crackles. Normal respiratory effort. No accessory muscle use.  Cardiovascular: Regular rate and rhythm, no murmurs / rubs / gallops. No extremity edema. 2+ pedal pulses. No carotid bruits.  Abdomen: no tenderness, no masses palpated. No hepatosplenomegaly. Bowel sounds positive.  Musculoskeletal: no clubbing / cyanosis. No joint deformity upper and lower extremities. Good ROM, no contractures. Normal muscle tone.  Skin: no rashes, lesions, ulcers. No induration Neurologic: CN 2-12 grossly intact. Sensation intact, DTR normal. Strength 5/5 in all 4.  Psychiatric: Normal judgment and insight. Alert and oriented x 3. Normal mood.    Data Reviewed:   CBC: Recent Labs  Lab 05/10/18 1302 05/14/18 1703 05/15/18 0318 05/16/18 0223  WBC  --  16.5* 15.7* 8.4  HGB 17.0 16.0 14.7 14.5  HCT 50.0 45.7 43.3 40.6  MCV  --  95.8 95.6 93.3  PLT  --  227 187 172   Basic Metabolic Panel: Recent Labs  Lab 05/15/18 0226 05/15/18 0318 05/15/18 0851 05/15/18 1455 05/16/18 0223  NA 129* 129* 130* 130* 136  K 3.5 3.4* 3.4* 5.6* 3.0*  CL 88* 87* 91* 95* 99  CO2 16* 18* 21* 17* 25  GLUCOSE 173* 144* 189* 212* 130*  BUN 31* 32* 26* 25* 11  CREATININE 2.61* 2.33* 1.91* 1.64* 1.23  CALCIUM 9.1 9.0 8.8* 8.8* 9.3  MG  --   --   --   --  2.2   GFR: Estimated Creatinine Clearance: 66.8 mL/min (by C-G formula based on SCr of 1.23 mg/dL). Liver Function  Tests: Recent Labs  Lab 05/14/18 1703  AST 40  ALT 34  ALKPHOS 63  BILITOT 2.2*  PROT 8.6*  ALBUMIN 4.7   Recent Labs  Lab 05/14/18 1703 05/15/18 0318  LIPASE 26 41   No results for input(s): AMMONIA in the last 168 hours. Coagulation Profile: No results for input(s): INR, PROTIME in the last 168 hours. Cardiac Enzymes: No results for input(s): CKTOTAL, CKMB, CKMBINDEX, TROPONINI in the last 168 hours. BNP (last 3 results) No results for input(s): PROBNP in the last 8760 hours. HbA1C: Recent Labs    05/15/18 0318  HGBA1C 11.0*   CBG: Recent Labs  Lab 05/15/18 1631 05/15/18 2015 05/16/18 0026 05/16/18 0409 05/16/18 0809  GLUCAP 234* 140* 127* 158* 221*   Lipid Profile: No results for input(s): CHOL, HDL, LDLCALC, TRIG, CHOLHDL, LDLDIRECT in the last 72 hours. Thyroid Function Tests: No results for input(s): TSH, T4TOTAL, FREET4, T3FREE, THYROIDAB in the last 72 hours. Anemia Panel: No results for input(s): VITAMINB12, FOLATE, FERRITIN, TIBC, IRON, RETICCTPCT in the last 72 hours. Sepsis Labs: Recent Labs  Lab 05/15/18 0851 05/15/18 1155  LATICACIDVEN 1.1 1.1    Recent Results (from the past 240 hour(s))  Urine culture     Status: Abnormal (Preliminary result)   Collection Time: 05/14/18  5:00 PM  Result Value Ref Range Status   Specimen Description URINE, RANDOM  Final   Special Requests NONE  Final   Culture (A)  Final    >=  100,000 COLONIES/mL UNIDENTIFIED ORGANISM Performed at [facility], [address] [place], [place] [address]    Report Status PENDING  Incomplete         Radiology Studies: Ct Abdomen Pelvis Wo Contrast  Result Date: 05/14/2018 CLINICAL DATA:  Acute abdominal pain, nausea, vomiting and diarrhea for 3 days. EXAM: CT ABDOMEN AND PELVIS WITHOUT CONTRAST TECHNIQUE: Multidetector CT imaging of the abdomen and pelvis was performed following the standard protocol without IV contrast. COMPARISON:  12/15/2016 right upper  quadrant abdominal sonogram. FINDINGS: Lower chest: No significant pulmonary nodules or acute consolidative airspace disease. Circumferential wall thickening in the lower thoracic esophagus. Hepatobiliary: Diffuse hepatic steatosis. No definite liver surface irregularity. No liver mass. Normal liver size. Normal gallbladder with no radiopaque cholelithiasis. No biliary ductal dilatation. Pancreas: Mild haziness of the peripancreatic fat surrounding the pancreatic head and neck, suggesting mild acute pancreatitis. No pancreatic mass or duct dilation. No peripancreatic fluid collections. Spleen: Normal size. No mass. Adrenals/Urinary Tract: Normal adrenals. No hydronephrosis. No renal stones. No contour deforming renal masses. Normal nondistended bladder. Stomach/Bowel: Normal non-distended stomach. Normal caliber small bowel with no small bowel wall thickening. Appendix is dilated (12 mm diameter) and fluid-filled with no definite appendiceal wall thickening or appreciable periappendiceal fat stranding. Normal large bowel with no diverticulosis, large bowel wall thickening or pericolonic fat stranding. Vascular/Lymphatic: Mildly atherosclerotic nonaneurysmal abdominal aorta. No pathologically enlarged lymph nodes in the abdomen or pelvis. Reproductive: Normal size prostate. Other: No pneumoperitoneum, ascites or focal fluid collection. Musculoskeletal: No aggressive appearing focal osseous lesions. Mild lumbar spondylosis. IMPRESSION: 1. Mild haziness of the peripancreatic fat surrounding the pancreatic head and neck, which may indicate mild acute pancreatitis. No peripancreatic fluid collections. 2. Appendix is dilated (12 mm diameter) and fluid-filled with no definite appendiceal wall thickening or appreciable periappendiceal inflammatory changes. Findings could indicate an appendiceal mucocele, with acute appendicitis not excluded. Surgical consultation may be considered. Short-term follow-up CT abdomen/pelvis  with oral and IV contrast may be considered. 3. Circumferential wall thickening in the lower thoracic esophagus, nonspecific, most commonly due to reflux esophagitis, with Barrett's esophagus or neoplasm not excluded by imaging. Further evaluation should be based on clinical assessment. 4. Diffuse hepatic steatosis. 5.  Aortic Atherosclerosis (ICD10-I70.0). Electronically Signed   By: Delbert PhenixJason A Poff M.D.   On: 05/14/2018 21:40   [place] Abdomen Limited Ruq  Result Date: 05/15/2018 CLINICAL DATA:  Right upper quadrant pain for 3 days EXAM: ULTRASOUND ABDOMEN LIMITED RIGHT UPPER QUADRANT COMPARISON:  None. FINDINGS: Gallbladder: No gallstones or wall thickening visualized. No sonographic Murphy sign noted by sonographer. Common bile duct: Diameter: 5 mm Liver: Hyperechoic liver parenchyma. Portal vein is patent on color Doppler imaging with normal direction of blood flow towards the liver. IMPRESSION: 1. Normal gallbladder. 2. Hepatic steatosis. Electronically Signed   By: Deatra RobinsonKevin  Herman M.D.   On: 05/15/2018 00:18        Scheduled Meds: . folic acid  1 mg Oral Daily  . gabapentin  300 mg Oral QHS  . heparin  5,000 Units Subcutaneous Q8H  . insulin aspart  0-15 Units Subcutaneous Q4H  . insulin aspart  0-5 Units Subcutaneous QHS  . insulin glargine  15 Units Subcutaneous Daily  . LORazepam  0-4 mg Intravenous Q6H   Followed by  . [START ON 05/17/2018] LORazepam  0-4 mg Intravenous Q12H  . multivitamin with minerals  1 tablet Oral Daily  . pantoprazole (PROTONIX) IV  40 mg Intravenous Q12H  . potassium chloride  30 mEq Oral Q4H  . sodium chloride flush  3 mL Intravenous Q12H  . thiamine  100 mg Oral Daily   Or  . thiamine  100 mg Intravenous Daily   Continuous Infusions: . sodium chloride Stopped (05/15/18 0800)  . cefTRIAXone (ROCEPHIN)  IV 1 g (05/15/18 2150)  . dextrose 5 % and 0.45% NaCl 100 mL/hr at 05/16/18 0934     LOS: 1 day   Time spent= 40 mins     Joline Maxcy, MD [facility] Pager [phone]   If 7PM-7AM, please contact night-coverage [email or web address] Password [email or web address] 05/16/2018, 10:40 AM

## 2018-05-16 NOTE — Progress Notes (Signed)
Spoke with patient about his diabetes. Was diagnosed about 1 year ago. Was on Metformin at diagnosis. Had insulin for a brief time, but was not taking anymore. Had used an insulin pen at that time and is familiar with procedure. States that he will need a blood glucose meter and strips at home. Was last seen by physician at [facility] about 3 months ago. Will need a follow up appointment. If patient is discharged on insulin, will need prescriptions so that he can have them filled at [facility]. Will continue to monitor blood sugars while in th hospital.  Smith Mince RN BSN CDE Diabetes Coordinator Pager: [phone]  8am-5pm

## 2018-05-17 ENCOUNTER — Other Ambulatory Visit: Payer: Self-pay

## 2018-05-17 DIAGNOSIS — N3000 Acute cystitis without hematuria: Secondary | ICD-10-CM

## 2018-05-17 LAB — BASIC METABOLIC PANEL
ANION GAP: 10 (ref 5–15)
BUN: 5 mg/dL — ABNORMAL LOW (ref 6–20)
CHLORIDE: 98 mmol/L (ref 98–111)
CO2: 24 mmol/L (ref 22–32)
Calcium: 9 mg/dL (ref 8.9–10.3)
Creatinine, Ser: 0.84 mg/dL (ref 0.61–1.24)
GFR calc Af Amer: >60 mL/min (ref >60)
GFR calc non Af Amer: >60 mL/min (ref >60)
Glucose, Bld: 201 mg/dL — ABNORMAL HIGH (ref 70–99)
Potassium: 3 mmol/L — ABNORMAL LOW (ref 3.5–5.1)
Sodium: 132 mmol/L — ABNORMAL LOW (ref 135–145)

## 2018-05-17 LAB — CBC
HEMATOCRIT: 38.3 % — AB (ref 39.0–52.0)
Hemoglobin: 13.5 g/dL (ref 13.0–17.0)
MCH: 33 pg (ref 26.0–34.0)
MCHC: 35.2 g/dL (ref 30.0–36.0)
MCV: 93.6 fL (ref 80.0–100.0)
Platelets: 156 10*3/uL (ref 150–400)
RBC: 4.09 MIL/uL — ABNORMAL LOW (ref 4.22–5.81)
RDW: 11 % — ABNORMAL LOW (ref 11.5–15.5)
WBC: 5.4 10*3/uL (ref 4.0–10.5)
nRBC: 0 % (ref 0.0–0.2)

## 2018-05-17 LAB — GLUCOSE, CAPILLARY
GLUCOSE-CAPILLARY: 210 mg/dL — AB (ref 70–99)
GLUCOSE-CAPILLARY: 249 mg/dL — AB (ref 70–99)
Glucose-Capillary: 126 mg/dL — ABNORMAL HIGH (ref 70–99)
Glucose-Capillary: 143 mg/dL — ABNORMAL HIGH (ref 70–99)
Glucose-Capillary: 153 mg/dL — ABNORMAL HIGH (ref 70–99)

## 2018-05-17 LAB — MAGNESIUM: Magnesium: 1.8 mg/dL (ref 1.7–2.4)

## 2018-05-17 MED ORDER — POTASSIUM CHLORIDE ER 20 MEQ PO TBCR: 20.0000 meq | EXTENDED_RELEASE_TABLET | Freq: Every day | ORAL | 0 refills | Status: AC

## 2018-05-17 MED ORDER — INSULIN DETEMIR 100 UNIT/ML FLEXPEN: 20.0000 [IU] | PEN_INJECTOR | Freq: Every day | SUBCUTANEOUS | 0 refills | Status: DC

## 2018-05-17 MED ORDER — INSULIN STARTER KIT- PEN NEEDLES (ENGLISH): 1.0000 | Freq: Once | 0 refills | Status: AC

## 2018-05-17 MED ORDER — INFLUENZA VAC SPLIT QUAD 0.5 ML IM SUSY: 0.5000 mL | PREFILLED_SYRINGE | INTRAMUSCULAR | Status: DC

## 2018-05-17 MED ORDER — ACETAMINOPHEN 325 MG PO TABS: 650.0000 mg | ORAL_TABLET | Freq: Four times a day (QID) | ORAL | Status: DC | PRN

## 2018-05-17 MED ORDER — POTASSIUM CHLORIDE CRYS ER 20 MEQ PO TBCR
40.0000 meq | EXTENDED_RELEASE_TABLET | Freq: Once | ORAL | Status: AC
  Administered 2018-05-17: 40 meq via ORAL
  Filled 2018-05-17: qty 2

## 2018-05-17 MED ORDER — CEPHALEXIN 500 MG PO CAPS: 500.0000 mg | ORAL_CAPSULE | Freq: Two times a day (BID) | ORAL | Status: DC

## 2018-05-17 MED ORDER — TRUE METRIX METER W/DEVICE KIT: 1.0000 | PACK | Freq: Three times a day (TID) | 0 refills | Status: AC

## 2018-05-17 MED ORDER — LANCETS 30G MISC: 1.0000 | Freq: Three times a day (TID) | 12 refills | Status: AC

## 2018-05-17 MED ORDER — CEPHALEXIN 500 MG PO CAPS: 500.0000 mg | ORAL_CAPSULE | Freq: Two times a day (BID) | ORAL | 0 refills | Status: DC

## 2018-05-17 MED ORDER — PANTOPRAZOLE SODIUM 40 MG PO TBEC
40.0000 mg | DELAYED_RELEASE_TABLET | Freq: Two times a day (BID) | ORAL | Status: DC
  Administered 2018-05-17: 40 mg via ORAL
  Filled 2018-05-17: qty 1

## 2018-05-17 MED ORDER — GLUCOSE BLOOD VI STRP: ORAL_STRIP | 12 refills | Status: AC

## 2018-05-17 MED ORDER — PNEUMOCOCCAL VAC POLYVALENT 25 MCG/0.5ML IJ INJ: 0.5000 mL | INJECTION | INTRAMUSCULAR | Status: DC

## 2018-05-17 NOTE — Clinical Social Work Note (Signed)
Clinical Social Work Assessment  Patient Details  Name: Richard Morgan MRN: [id number] Date of Birth: Feb 24, 1976  Date of referral:  05/17/18               Reason for consult:  Substance Use/ETOH Abuse                Permission sought to share information with:  Family Supports Permission granted to share information::  Yes, Verbal Permission Granted  Name::        Agency::     Relationship::  Spouse at bedside  Contact Information:     Housing/Transportation Living arrangements for the past 2 months:  Single Family Home Source of Information:  Patient, Medical Team, Spouse Patient Interpreter Needed:  None Criminal Activity/Legal Involvement Pertinent to Current Situation/Hospitalization:  No - Comment as needed Significant Relationships:  Spouse, Adult Children Lives with:  Spouse Do you feel safe going back to the place where you live?  Yes Need for family participation in patient care:  Yes (Comment)  Care giving concerns:  Substance abuse.   Social Worker assessment / plan:  Received call from Therapist, sports. Wife is requesting substance abuse treatment resources. CSW met with patient. Wife at bedside. CSW introduced role and confirmed interest. Provided packet of inpatient and outpatient options within [place] and surrounding counties. No further concerns. CSW signing off as social work intervention is no longer needed.  Employment status:  [employer] information:  Self Pay (Medicaid Pending) PT Recommendations:  Not assessed at this time Information / Referral to community resources:  Residential Substance Abuse Treatment Options, Outpatient Substance Abuse Treatment Options  Patient/Family's Response to care:  Patient and his wife agreeable to receiving resources. Patient's wife supportive and involved in patient's care. Patient and his wife appreciated social work intervention.  Patient/Family's Understanding of and Emotional Response to Diagnosis, Current  Treatment, and Prognosis:  Patient and his wife have a good understanding of the reason for admission and social work consult. Patient and his wife appear happy with hospital care.  Emotional Assessment Appearance:  Appears stated age Attitude/Demeanor/Rapport:  Engaged, Gracious Affect (typically observed):  Accepting, Appropriate, Calm, Pleasant Orientation:  Oriented to Self, Oriented to Place, Oriented to  Time, Oriented to Situation Alcohol / Substance use:  Alcohol Use Psych involvement (Current and /or in the community):  No (Comment)  Discharge Needs  Concerns to be addressed:  Care Coordination Readmission within the last 30 days:  No Current discharge risk:  Substance Abuse Barriers to Discharge:  Continued Medical Work up   Candie Chroman, LCSW 05/17/2018, 4:27 PM

## 2018-05-17 NOTE — Care Management Note (Signed)
Case Management Note  Patient Details  Name: Richard Morgan MRN: [id number] Date of Birth: 11/29/1975  Subjective/Objective:   For discharge, NCM assisted patient with Match Letter.                   Action/Plan: DC when ready.  Expected Discharge Date:  05/17/18               Expected Discharge Plan:  Home/Self Care  In-House Referral:     Discharge planning Services  CM Consult, Indigent Health Clinic, [facility], [facility]  Post Acute Care Choice:    Choice offered to:     DME Arranged:    DME Agency:     HH Arranged:    HH Agency:     Status of Service:  Completed, signed off  If discussed at [employer] Meetings, dates discussed:    Additional Comments:  [name], [name], RN 05/17/2018, 5:25 PM

## 2018-05-17 NOTE — Progress Notes (Signed)
IV and telemetry discontinued at this time. Discharge instructions reviewed with patient and all questions answered.   Ernestina Columbia, RN

## 2018-05-17 NOTE — Discharge Instructions (Signed)
Diabetic Ketoacidosis  Diabetic ketoacidosis is a serious complication of diabetes. This condition develops when there is not enough insulin in the body. Insulin is an hormone that regulates blood sugar levels in the body. Normally, insulin allows glucose to enter the cells in the body. The cells break down glucose for energy. Without enough insulin, the body cannot break down glucose, so it breaks down fats instead. This leads to high blood glucose levels in the body and the production of acids that are called ketones. Ketones are poisonous at high levels.  If diabetic ketoacidosis is not treated, it can cause severe dehydration and can lead to a coma or death.  What are the causes?  This condition develops when a lack of insulin causes the body to break down fats instead of glucose. This may be triggered by:  · Stress on the body. This stress is brought on by an illness.  · Infection.  · Medicines that raise blood glucose levels.  · Not taking diabetes medicine.  · New onset of type 1 diabetes mellitus.  What are the signs or symptoms?  Symptoms of this condition include:  · Fatigue.  · Weight loss.  · Excessive thirst.  · Light-headedness.  · Fruity or sweet-smelling breath.  · Excessive urination.  · Vision changes.  · Confusion or irritability.  · Nausea.  · Vomiting.  · Rapid breathing.  · Abdominal pain.  · Feeling flushed.  How is this diagnosed?  This condition is diagnosed based on your medical history, a physical exam, and blood tests. You may also have a urine test to check for ketones.  How is this treated?  This condition may be treated with:  · Fluid replacement. This may be done to correct dehydration.  · Insulin injections. These may be given through the skin or through an IV tube.  · Electrolyte replacement. Electrolytes are minerals in your blood. Electrolytes such as potassium and sodium may be given in pill form or through an IV tube.  · Antibiotic medicines. These may be prescribed if your  condition was caused by an infection.  Diabetic ketoacidosis is a serious medical condition. You may need emergency treatment in the hospital to monitor your condition.  Follow these instructions at home:  Eating and drinking  · Drink enough fluids to keep your urine clear or pale yellow.  · If you are not able to eat, drink clear fluids in small amounts as you are able. Clear fluids include water, ice chips, fruit juice with water added (diluted), and low-calorie sports drinks. You may also have sugar-free jello or popsicles.  · If you are able to eat, follow your usual diet and drink sugar-free liquids, such as water.  Medicines  · Take over-the-counter and prescription medicines only as told by your health care provider.  · Continue to take insulin and other diabetes medicines as told by your health care provider.  · If you were prescribed an antibiotic, take it as told by your health care provider. Do not stop taking the antibiotic even if you start to feel better.  General instructions    · Check your urine for ketones when you are ill and as told by your health care provider.  ? If your blood glucose is 240 mg/dL (13.3 mmol/L) or higher, check your urine ketones every 4-6 hours.  · Check your blood glucose every day, as often as told by your health care provider.  ? If your blood glucose is high, drink   plenty of fluids. This helps to flush out ketones.  ? If your blood glucose is above your target for 2 tests in a row, contact your health care provider.  · Carry a medical alert card or wear medical alert jewelry that says that you have diabetes.  · Rest and exercise only as told by your health care provider. Do not exercise when your blood glucose is high and you have ketones in your urine.  · If you get sick, call your health care provider and begin treatment quickly. Your body often needs extra insulin to fight an illness. Check your blood glucose every 4-6 hours when you are sick.  · Keep all follow-up  visits as told by your health care provider. This is important.  Contact a health care provider if:  · Your blood glucose level is higher than 240 mg/dL (13.3 mmol/L) for 2 days in a row.  · You have moderate or large ketones in your urine.  · You have a fever.  · You cannot eat or drink without vomiting.  · You have been vomiting for more than 2 hours.  · You continue to have symptoms of diabetic ketoacidosis.  · You develop new symptoms.  Get help right away if:  · Your blood glucose monitor reads “high” even when you are taking insulin.  · You faint.  · You have chest pain.  · You have trouble breathing.  · You have sudden trouble speaking or swallowing.  · You have vomiting or diarrhea that gets worse after 3 hours.  · You are unable to stay awake.  · You have trouble thinking.  · You are severely dehydrated. Symptoms of severe dehydration include:  ? Extreme thirst.  ? Dry mouth.  ? Rapid breathing.  These symptoms may represent a serious problem that is an emergency. Do not wait to see if the symptoms will go away. Get medical help right away. Call your local emergency services (911 in the [place] Do not drive yourself to the hospital.  Summary  · Diabetic ketoacidosis is a serious complication of diabetes. This condition develops when there is not enough insulin in the body.  · This condition is diagnosed based on your medical history, a physical exam, and blood tests. You may also have a urine test to check for ketones.  · Diabetic ketoacidosis is a serious medical condition. You may need emergency treatment in the hospital to monitor your condition.  · Contact your health care provider if your blood glucose is higher than 240 mg/dl for 2 days in a row or if you have moderate or large ketones in your urine.  This information is not intended to replace advice given to you by your health care provider. Make sure you discuss any questions you have with your health care provider.  Document Released: 04/28/2000  Document Revised: 06/05/2016 Document Reviewed: 06/05/2016  Elsevier Interactive Patient Education © 2019 [employer] Inc[employer]

## 2018-05-17 NOTE — Plan of Care (Signed)
  Problem: Education: Goal: Knowledge of General Education information will improve Description: Including pain rating scale, medication(s)/side effects and non-pharmacologic comfort measures Outcome: Progressing   Problem: Clinical Measurements: Goal: Will remain free from infection Outcome: Progressing Goal: Cardiovascular complication will be avoided Outcome: Progressing   

## 2018-05-17 NOTE — Discharge Summary (Signed)
Richard Morgan  MR#: [id number]  DOB:January 08, 1976  Date of Admission: 05/14/2018 Date of Discharge: 05/17/2018  Attending Physician:Esme Durkin Hennie Duos, MD  Patient's GYI:RSWNIOE, No Pcp Per  Consults: Gen Surgery   Disposition: D/C home   Follow-up Appts: Follow-up Information    [facility] [facility]. Go on 06/11/2018.   Why:  f/u appointment made for 10:10 am- please arrive 15 early, bring ID and medication list.  Contact information: [place] [phone]          Tests Needing Follow-up: -assess CBG control -assess alcohol abstinence   Discharge Diagnoses: DKA in Uncontrolled DM2 Acute kidney injury Group B Strep Urinary tract infection  Acute early pancreatitis Dilated appendix suggestive of possible appendicitis Hypokalemia History of alcohol use  Initial presentation: 43 year old male w/ a hx of DM2 and alcohol use who c/o intermittent right upper quadrant and lower quadrant pain. In the ED he was noted to have significant hyperglycemia with anion gap suggestive of diabetic ketoacidosis, mild acute pancreatitis, a urinary tract infection, and a slightly dilated appendix.  Hospital Course:  DKA in Uncontrolled DM2 A1c is 11.0 - transitioned to subcutaneous insulin - DM Coordinator worked w/ patient - CM assisted in outpt f/u and med procurement - CBG stable at time of d/c - importance of med compliance stressed to patient   Acute kidney injury Baseline creatinine is 0.7 - renal fxn normalized w/ volume expansion    Group B Strep Urinary tract infection  Urine culture noted >100,000 colonies - empirically on Rocephin for 3 days - transitioned to oral abx at time of d/c   Acute early pancreatitis Right upper quadrant ultrasound noted normal gallbladder and hepatic steatosis - tolerating regular diet at time of d/c w/o abdom pain   Dilated appendix  suggestive of possible appendicitis Appreciate General Surgery input > no surgical needs at this time  Hypokalemia Due to DKA - should normalize w/ improved intake at home and CBG control - dosed w/ Kdur again prior to d/c   History of alcohol use on alcohol withdrawal protocol during admit, but w/o evidence of active withdrawal at time of d/c   Allergies as of 05/17/2018   No Known Allergies     Medication List    STOP taking these medications   metFORMIN 1000 MG tablet Commonly known as:  GLUCOPHAGE   phosphorus 155-852-130 MG tablet Commonly known as:  K PHOS NEUTRAL   thiamine 100 MG tablet Commonly known as:  VITAMIN B-1     TAKE these medications   cephALEXin 500 MG capsule Commonly known as:  KEFLEX Take 1 capsule (500 mg total) by mouth every 12 (twelve) hours. Start taking on:  May 18, 2018   gabapentin 300 MG capsule Commonly known as:  NEURONTIN Take 1 capsule (300 mg total) by mouth at bedtime.   glucose blood test strip Commonly known as:  TRUE METRIX BLOOD GLUCOSE TEST Use as instructed   Insulin Detemir 100 UNIT/ML Pen Commonly known as:  LEVEMIR FLEXPEN Inject 20 Units into the skin daily. What changed:    how much to take  when to take this   insulin starter kit- pen needles Misc 1 kit by Other route once for 1 dose.   Lancets 30G Misc 1 each by Does not apply route 3 (three) times daily.   lisinopril 10 MG tablet Commonly known as:  PRINIVIL,ZESTRIL Take 1 tablet (10 mg total) by mouth daily.  Potassium Chloride ER 20 MEQ Tbcr Take 20 mEq by mouth daily. What changed:  medication strength   TRUE METRIX METER w/Device Kit 1 each by Does not apply route 3 (three) times daily.       Day of Discharge BP (!) 157/109 (BP Location: Right Arm)   Pulse 85   Temp 98.4 F (36.9 C) (Oral)   Resp 18   Ht _0  (1.626 m)   Wt 70.6 kg Comment: scale B  SpO2 98%   BMI 26.72 kg/m   Physical Exam: General: No acute respiratory  distress Lungs: Clear to auscultation bilaterally without wheezes or crackles Cardiovascular: Regular rate and rhythm without murmur gallop or rub normal S1 and S2 Abdomen: Nontender, nondistended, soft, bowel sounds positive, no rebound, no ascites, no appreciable mass Extremities: No significant cyanosis, clubbing, or edema bilateral lower extremities  Basic Metabolic Panel: Recent Labs  Lab 05/15/18 0318 05/15/18 0851 05/15/18 1455 05/16/18 0223 05/17/18 0347  NA 129* 130* 130* 136 132*  K 3.4* 3.4* 5.6* 3.0* 3.0*  CL 87* 91* 95* 99 98  CO2 18* 21* 17* 25 24  GLUCOSE 144* 189* 212* 130* 201*  BUN 32* 26* 25* 11 5*  CREATININE 2.33* 1.91* 1.64* 1.23 0.84  CALCIUM 9.0 8.8* 8.8* 9.3 9.0  MG  --   --   --  2.2 1.8    Liver Function Tests: Recent Labs  Lab 05/14/18 1703  AST 40  ALT 34  ALKPHOS 63  BILITOT 2.2*  PROT 8.6*  ALBUMIN 4.7   Recent Labs  Lab 05/14/18 1703 05/15/18 0318  LIPASE 26 41    CBC: Recent Labs  Lab 05/14/18 1703 05/15/18 0318 05/16/18 0223 05/17/18 0347  WBC 16.5* 15.7* 8.4 5.4  HGB 16.0 14.7 14.5 13.5  HCT 45.7 43.3 40.6 38.3*  MCV 95.8 95.6 93.3 93.6  PLT 227 187 172 156    CBG: Recent Labs  Lab 05/16/18 2052 05/17/18 0035 05/17/18 0418 05/17/18 0822 05/17/18 1152  GLUCAP 226* 126* 210* 153* 143*    Recent Results (from the past 240 hour(s))  Urine culture     Status: Abnormal   Collection Time: 05/14/18  5:00 PM  Result Value Ref Range Status   Specimen Description URINE, RANDOM  Final   Special Requests NONE  Final   Culture (A)  Final    >=100,000 COLONIES/mL GROUP B STREP(S.AGALACTIAE)ISOLATED TESTING AGAINST S. AGALACTIAE NOT ROUTINELY PERFORMED DUE TO PREDICTABILITY OF AMP/PEN/VAN SUSCEPTIBILITY. Performed at [facility], [place] [address] [place], [place] [address]    Report Status 05/16/2018 FINAL  Final     Time spent in discharge (includes decision making & examination of pt): 35  minutes  05/17/2018, 4:51 PM   Cherene Altes, MD [facility] Office  [phone] Pager [phone]  On-Call/Text Page:      [email or web address]      password [facility]

## 2018-05-21 ENCOUNTER — Ambulatory Visit: Payer: Self-pay | Admitting: Pharmacist

## 2018-05-21 NOTE — Progress Notes (Deleted)
    S:     No chief complaint on file.   Patient arrives ***.  Presents for diabetes evaluation, education, and management at the request of ***. Patient was referred on ***.  Patient was last seen by Primary Care Provider on ***.   Patient reports Diabetes was diagnosed in ***.   Family/Social History: ***  Insurance coverage/medication affordability: ***  Patient {Actions; denies-reports:120008} adherence with medications.  Current diabetes medications include: *** Current hypertension medications include: ***  Patient {Actions; denies-reports:120008} hypoglycemic events.  Patient reported dietary habits: Eats *** meals/day Breakfast:*** Lunch:*** Dinner:*** Snacks:*** Drinks:***  Patient-reported exercise habits: ***   Patient {Actions; denies-reports:120008} nocturia.  Patient {Actions; denies-reports:120008} neuropathy. Patient {Actions; denies-reports:120008} visual changes. Patient {Actions; denies-reports:120008} self foot exams.   Benedict Needy   O:  Physical Exam   ROS   Lab Results  Component Value Date   HGBA1C 11.0 (H) 05/15/2018   There were no vitals filed for this visit.  Lipid Panel     Component Value Date/Time   CHOL 143 03/15/2017 1551   TRIG 71 03/15/2017 1551   HDL 56 03/15/2017 1551   CHOLHDL 2.6 03/15/2017 1551   LDLCALC 73 03/15/2017 1551    Home fasting CBG: ***  2 hour post-prandial/random CBG: ***.   Clinical ASCVD: {YES/NO:21197} The 10-year ASCVD risk score Denman George DC Jr., et al., 2013) is: 1.5%   Values used to calculate the score:     Age: 43 years     Sex: Male     Is Non-Hispanic African American: No     Diabetic: Yes     Tobacco smoker: No     Systolic Blood Pressure: 154 mmHg     Is BP treated: Yes     HDL Cholesterol: 56 mg/dL     Total Cholesterol: 143 mg/dL    A/P: Diabetes longstanding*** currently ***. Patient is not*** able to verbalize appropriate hypoglycemia management plan. Patient {Is/is  not:9024} adherent with medication. Control is suboptimal due to ***. -{Meds adjust:18428} basal insulin *** (insulin ***). Patient will continue to titrate 1 unit every ***days if fasting CBGs > 100mg /dl until fasting CBGs reach goal or next visit.  -{Meds adjust:18428}  rapid insulin *** (insulin ***) to ***.  -{Meds adjust:18428} GLP-1 *** (generic name***) to ***.  -{Meds adjust:18428} SGLT2-I *** (generic name***) to ***. Counseled on sick day rules for ***. -Extensively discussed pathophysiology of DM, recommended lifestyle interventions, dietary effects on glycemic control -Counseled on s/sx of and management of hypoglycemia -Next A1C anticipated ***.   ASCVD risk - primary***secondary prevention in patient with DM. Last LDL {Is/is not:9024} controlled. ASCVD risk score {Is/is not:9024} >20%  - {Desc; low/moderate/high:110033} intensity statin indicated. Aspirin {Is/is not:9024} indicated.  -{Meds adjust:18428} aspirin *** mg  -{Meds adjust:18428} ***statin *** mg.   Hypertension longstanding*** currently ***.  BP goal = *** mmHg. Patient {Is/is not:9024} adherent with medication. Control is suboptimal due to ***. -***  Written patient instructions provided.  Total time in face to face counseling *** minutes.   Follow up Pharmacist/PCP*** Clinic Visit in ***.   Patient seen with ***

## 2018-06-11 ENCOUNTER — Other Ambulatory Visit: Payer: Self-pay

## 2018-06-11 ENCOUNTER — Ambulatory Visit (INDEPENDENT_AMBULATORY_CARE_PROVIDER_SITE_OTHER): Payer: Self-pay | Admitting: Family Medicine

## 2018-06-11 ENCOUNTER — Encounter (INDEPENDENT_AMBULATORY_CARE_PROVIDER_SITE_OTHER): Payer: Self-pay | Admitting: Family Medicine

## 2018-06-11 VITALS — BP 143/106 | HR 89 | Temp 98.2°F | Ht 64.0 in | Wt 165.4 lb

## 2018-06-11 DIAGNOSIS — I10 Essential (primary) hypertension: Secondary | ICD-10-CM

## 2018-06-11 DIAGNOSIS — IMO0002 Reserved for concepts with insufficient information to code with codable children: Secondary | ICD-10-CM

## 2018-06-11 DIAGNOSIS — E1165 Type 2 diabetes mellitus with hyperglycemia: Secondary | ICD-10-CM

## 2018-06-11 DIAGNOSIS — R82998 Other abnormal findings in urine: Secondary | ICD-10-CM

## 2018-06-11 DIAGNOSIS — E1140 Type 2 diabetes mellitus with diabetic neuropathy, unspecified: Secondary | ICD-10-CM

## 2018-06-11 DIAGNOSIS — R197 Diarrhea, unspecified: Secondary | ICD-10-CM

## 2018-06-11 LAB — POCT URINALYSIS DIP (CLINITEK)
Bilirubin, UA: NEGATIVE
Blood, UA: NEGATIVE
Glucose, UA: NEGATIVE mg/dL
Ketones, POC UA: NEGATIVE mg/dL
Nitrite, UA: NEGATIVE
POC PROTEIN,UA: NEGATIVE
Spec Grav, UA: 1.02
Urobilinogen, UA: 0.2 U/dL
pH, UA: 5.5

## 2018-06-11 MED ORDER — INSULIN DETEMIR 100 UNIT/ML FLEXPEN: 20.0000 [IU] | PEN_INJECTOR | Freq: Every day | SUBCUTANEOUS | 3 refills | Status: AC

## 2018-06-11 MED ORDER — KLOR-CON M20 20 MEQ PO TBCR: 20.0000 meq | EXTENDED_RELEASE_TABLET | Freq: Every day | ORAL | 3 refills | Status: AC

## 2018-06-11 MED ORDER — LISINOPRIL 20 MG PO TABS: 20.0000 mg | ORAL_TABLET | Freq: Every day | ORAL | 5 refills | Status: AC

## 2018-06-11 MED ORDER — GABAPENTIN 300 MG PO CAPS: 300.0000 mg | ORAL_CAPSULE | Freq: Every day | ORAL | 5 refills | Status: AC

## 2018-06-11 MED ORDER — NYSTATIN 100000 UNIT/GM EX CREA: 1.0000 "application " | TOPICAL_CREAM | Freq: Two times a day (BID) | CUTANEOUS | 4 refills | Status: AC

## 2018-06-11 MED FILL — LISINOPRIL 20 MG TAB: 20 | 30 days supply | Qty: 30 | Fill #0

## 2018-06-11 MED FILL — GABAPENTIN 300 MG CAPSULE: 300 | 30 days supply | Qty: 30 | Fill #0

## 2018-06-11 MED FILL — POTASSIUM CL ER 20 MEQ TAB: 20 | 30 days supply | Qty: 30 | Fill #0

## 2018-06-11 MED FILL — NYSTATIN 100,000 UNIT/GM CR: 100000 | 10 days supply | Qty: 15 | Fill #0

## 2018-06-11 MED FILL — !LEVEMIR FLEXTOUCH 100 UNIT: 100 | 30 days supply | Qty: 6 | Fill #0

## 2018-06-11 NOTE — Progress Notes (Deleted)
Subjective:    Patient ID: [facility], male    DOB: Jan 11, 1976, 43 y.o.   MRN: [id number]  HPI       43 year old male new to the practice.  Patient is status post hospitalization from 05/14/2018 through 05/17/2018 secondary to diabetic ketoacidosis due to uncontrolled type 2 diabetes, acute kidney injury, group B strep urinary tract infection, acute pancreatitis, history of alcohol use, dilated appendix suggestive of possible appendicitis and hypokalemia.  Patient presented to the emergency department with intermittent right upper quadrant and right lower quadrant pain and he was noted to have significant hyperglycemia in the emergency department.  Patient's hemoglobin A1c is 11.0.  Patient had elevated creatinine which normalized to baseline of 0.7 with rehydration and treatment of urinary tract infection.  Patient did not require appendectomy after evaluation by general surgery but had dilated appendix on imaging.  Patient received Rocephin while inpatient for treatment of urinary tract infection and was discharged on Keflex.  Patient was given prescription for Neurontin for neuropathy.  Patient was discharged on Levemir FlexPen, 20 units daily.  Patient was also given prescriptions for lisinopril 10 mg daily and potassium chloride 20 mEq daily.  Patient's blood pressure on day of discharge was still elevated at 157/109.      Patient reports that his blood sugars have improved on his current medications.  Patient reports that blood sugars are generally 145 or less.  Patient has no increased thirst, no blurred vision.  Patient does report urinary frequency.  Patient also with complaint of a itchy, moist rash in the groin area and patient has similar itchiness on the skin near the tip of his penis.  Patient reports no headaches or dizziness related to his blood pressure.  Patient denies any problems with a nonproductive cough associated with his blood pressure medication use.  Patient states that  overall he feels well.  Patient is taking the gabapentin prescribed and states that he now has minimal issues with tingling sensation/pinprick sensation in his feet.  Patient denies any chest pain or palpitations, no shortness of breath.  Patient does have issues with chronic diarrhea for which he has been followed in the past by GI.  Patient denies any abdominal pain.  Patient feels that overall he is doing well at this time.  Patient does have some concerns financially as he is currently unemployed but normally works in [employer].       Past Medical History:  Diagnosis Date  . Diabetes mellitus without complication (HCC)    Family History  Problem Relation Age of Onset  . Diabetes Neg Hx   . Hypertension Neg Hx   . Cancer Neg Hx    Social History   Tobacco Use  . Smoking status: Never Smoker  . Smokeless tobacco: Never Used  Substance Use Topics  . Alcohol use: Yes    Frequency: Never    Comment: occ  . Drug use: No  No Known Allergies      Review of Systems  Constitutional: Negative for chills, fatigue and fever.  Respiratory: Negative for cough and shortness of breath.   Cardiovascular: Negative for chest pain, palpitations and leg swelling.  Gastrointestinal: Positive for diarrhea. Negative for abdominal pain.  Endocrine: Positive for polyuria. Negative for polydipsia and polyphagia.  Genitourinary: Positive for frequency. Negative for dysuria and flank pain.  Musculoskeletal: Negative for arthralgias and gait problem.  Skin: Positive for rash (Complaint of moist, slightly erythematous, itchy rash in the groin as well as  some itchiness on the skin near the head of the penis). Negative for wound.  Neurological: Negative for dizziness and headaches.  Hematological: Negative for adenopathy. Does not bruise/bleed easily.       Objective:   Physical Exam Vitals signs and nursing note reviewed.  Constitutional:      Appearance: Normal appearance. He is normal weight.      Comments: Patient is accompanied by his wife at today's visit  HENT:     Right Ear: Ear canal and external ear normal. There is impacted cerumen.     Left Ear: Ear canal and external ear normal. There is impacted cerumen.     Nose: Nose normal. No congestion or rhinorrhea.     Mouth/Throat:     Mouth: Mucous membranes are moist.     Pharynx: Oropharynx is clear.  Eyes:     Extraocular Movements: Extraocular movements intact.     Conjunctiva/sclera: Conjunctivae normal.  Neck:     Musculoskeletal: Normal range of motion and neck supple.     Vascular: No carotid bruit.  Cardiovascular:     Rate and Rhythm: Normal rate and regular rhythm.     Pulses:          Dorsalis pedis pulses are 2+ on the right side and 2+ on the left side.       Posterior tibial pulses are 2+ on the right side and 2+ on the left side.  Pulmonary:     Effort: Pulmonary effort is normal.     Breath sounds: Normal breath sounds.  Abdominal:     General: Bowel sounds are normal.     Palpations: Abdomen is soft.     Tenderness: There is no abdominal tenderness. There is no right CVA tenderness, left CVA tenderness or guarding.  Musculoskeletal: Normal range of motion.        General: No swelling.     Right lower leg: No edema.     Left lower leg: No edema.     Right foot: Normal range of motion. No deformity, bunion, Charcot foot, foot drop or prominent metatarsal heads.     Left foot: Normal range of motion. No deformity, bunion, Charcot foot, foot drop or prominent metatarsal heads.  Feet:     Right foot:     Skin integrity: Skin integrity normal.     Toenail Condition: Right toenails are normal.     Left foot:     Skin integrity: Skin integrity normal.     Toenail Condition: Left toenails are normal.  Lymphadenopathy:     Cervical: No cervical adenopathy.  Skin:    General: Skin is warm and dry.     Comments: Patient declined exam of rash in groin area   Neurological:     General: No focal deficit  present.     Mental Status: He is alert and oriented to person, place, and time.  Psychiatric:        Mood and Affect: Mood normal.        Behavior: Behavior normal.        Thought Content: Thought content normal.        Judgment: Judgment normal.    BP (!) 143/106 (BP Location: Left Arm, Patient Position: Sitting, Cuff Size: Normal)   Pulse 89   Temp 98.2 F (36.8 C) (Oral)   Ht 5\' 4"  (1.626 m)   Wt 165 lb 6.4 oz (75 kg)   SpO2 99%   BMI 28.39 kg/m  Assessment & Plan:  1. Uncontrolled type 2 diabetes with neuropathy [facility](HCC) Patient reports that his blood sugars are now much better controlled.  Patient reports that he urinates to his medications.  Patient is provided with new refills at today's visit.  Patient will have urinalysis secondary to his complaints of continued urinary frequency as well as complete metabolic panel in follow-up of diabetes and his use of medications.  Patient is encouraged to continue dietary changes and continue to check his blood sugars and discussed hemoglobin A1c goal of 7.0 or less.  Also discussed diabetic foot care and the need for patient to have yearly diabetic eye exam - POCT URINALYSIS DIP (CLINITEK) - Comprehensive metabolic panel - gabapentin (NEURONTIN) 300 MG capsule; Take 1 capsule (300 mg total) by mouth at bedtime.  Dispense: 30 capsule; Refill: 5 - Insulin Detemir (LEVEMIR FLEXPEN) 100 UNIT/ML Pen; Inject 20 Units into the skin daily.  Dispense: 15 mL; Refill: 3 - nystatin cream (MYCOSTATIN); Apply 1 application topically 2 (two) times daily.  Dispense: 30 g; Refill: 4  2. Essential hypertension Patient will have increase in dose of his lisinopril from 10 to 20 mg as his blood pressure is not currently controlled.  Patient has prescriptions for both lisinopril and potassium.  Patient will be having labs to check his electrolytes at today's visit as patient's lisinopril can increase potassium and patient is additionally on a potassium  supplement but patient apparently has had issues with longstanding diarrhea for which he has been followed by gastroenterology and this may be the source of his need for potassium supplement. - lisinopril (PRINIVIL,ZESTRIL) 20 MG tablet; Take 1 tablet (20 mg total) by mouth daily. To lower blood pressure  Dispense: 30 tablet; Refill: 5 - KLOR-CON M20 20 MEQ tablet; Take 1 tablet (20 mEq total) by mouth daily.  Dispense: 30 tablet; Refill: 3  3. Diarrhea, unspecified type Patient with complaint of longstanding issues with diarrhea for which he has seen GI in the past.  Patient will have CMP at today's visit in follow-up.  4. Leukocytes in urine Patient with complaint of urinary frequency and urinalysis was obtained which was abnormal secondary to the presence of leukocytes in the urine.  Patient's urine will be sent for culture.  Patient also with complaint of itching in the genital area which may represent yeast infection which could also be causing the presence of patient's leukocytes. - Urine Culture  An After Visit Summary was printed and given to the patient.  Return in about 4 months (around 10/10/2018) for HTN- 2 week nurse visit; DM/HTN 4 months.

## 2018-06-11 NOTE — Patient Instructions (Signed)
Please take over-the-counter Imodium to help with diarrhea.  Eat yogurt with active cultures such as activity a once daily for the next week to help replace good bacteria.  Return in approximately 2 weeks to have a nurse visit for blood pressure recheck.  Please call or return if you are having any issues with your blood sugars, blood pressure or continued diarrhea or if you again develop issues with abdominal pain.

## 2018-06-12 ENCOUNTER — Encounter (INDEPENDENT_AMBULATORY_CARE_PROVIDER_SITE_OTHER): Payer: Self-pay | Admitting: Family Medicine

## 2018-06-12 LAB — COMPREHENSIVE METABOLIC PANEL WITH GFR
ALT: 20 IU/L (ref 0–44)
AST: 15 IU/L (ref 0–40)
Albumin/Globulin Ratio: 1.5 (ref 1.2–2.2)
Albumin: 4.5 g/dL (ref 4.0–5.0)
Alkaline Phosphatase: 70 IU/L (ref 39–117)
BUN/Creatinine Ratio: 11 (ref 9–20)
BUN: 6 mg/dL (ref 6–24)
Bilirubin Total: 0.4 mg/dL (ref 0.0–1.2)
CO2: 23 mmol/L (ref 20–29)
Calcium: 10 mg/dL (ref 8.7–10.2)
Chloride: 102 mmol/L (ref 96–106)
Creatinine, Ser: 0.57 mg/dL — ABNORMAL LOW (ref 0.76–1.27)
GFR calc Af Amer: 148 mL/min/1.73
GFR calc non Af Amer: 128 mL/min/1.73
Globulin, Total: 3 g/dL (ref 1.5–4.5)
Glucose: 108 mg/dL — ABNORMAL HIGH (ref 65–99)
Potassium: 4.8 mmol/L (ref 3.5–5.2)
Sodium: 140 mmol/L (ref 134–144)
Total Protein: 7.5 g/dL (ref 6.0–8.5)

## 2018-06-12 NOTE — Progress Notes (Signed)
Subjective:    Patient ID: [facility], male    DOB: 21-Apr-1976, 43 y.o.   MRN: [id number]  HPI       43 year old male new to the practice.  Patient is status post hospitalization from 05/14/2018 through 05/17/2018 secondary to diabetic ketoacidosis due to uncontrolled type 2 diabetes, acute kidney injury, group B strep urinary tract infection, acute pancreatitis, history of alcohol use, dilated appendix suggestive of possible appendicitis and hypokalemia.  Patient presented to the emergency department with intermittent right upper quadrant and right lower quadrant pain and he was noted to have significant hyperglycemia in the emergency department.  Patient's hemoglobin A1c was 11.0.  Patient had elevated creatinine which normalized to baseline of 0.7 with rehydration and treatment of urinary tract infection.  Patient did not require appendectomy after evaluation by general surgery but had dilated appendix on imaging.  Patient received Rocephin while inpatient for treatment of urinary tract infection and was discharged on Keflex patient was given prescription for Neurontin for neuropathy.  Patient was discharged on Levemir FlexPen 20 units daily, lisinopril 10 mg daily and potassium chloride 20 mEq daily.  Patient's blood pressure on his day of discharge was 157/109.      At today's visit, he reports that his blood sugars have improved on his current medications as his fasting blood sugars are generally 145 or less.  Patient denies any increased thirst and no blurred vision.  Patient does have urinary frequency.  Patient denies dysuria but patient has had an itchy rash in his groin area and also at the tip of his penis.  Patient states that the rash is moist and slightly red at times.  Patient would like medication to help with the rash.  Patient denies headaches or dizziness related to his blood pressure.  Patient has had no issues with a dry, nonproductive cough with the use of lisinopril.  Patient  is taking the gabapentin that was also prescribed at time of hospital discharge and patient has minimal discomfort in his feet at this time.  Patient previously was having recurrent sensation of pins sticking into his feet and occasional numbness.  Patient denies any chest pain or palpitations, no shortness of breath and no abdominal pain.  Patient does have chronic diarrhea for which he has been followed by gastroenterology.  Patient feels that overall he is doing well at this time.  Patient however does have concern financially because he is currently out of work and previously worked in [employer].  Patient reports that he has stopped drinking alcohol since his recent hospitalization.  Past Medical History:  Diagnosis Date  . Diabetes mellitus without complication ([place])    Family History  Problem Relation Age of Onset  . Diabetes Neg Hx   . Hypertension Neg Hx   . Cancer Neg Hx    Social History   Tobacco Use  . Smoking status: Never Smoker  . Smokeless tobacco: Never Used  Substance Use Topics  . Alcohol use: Yes    Frequency: Never    Comment: occ  . Drug use: No  No Known Allergies    Review of Systems  Constitutional: Negative for chills and fever.  Eyes: Negative for photophobia and visual disturbance.  Respiratory: Negative for cough and shortness of breath.   Cardiovascular: Negative for chest pain, palpitations and leg swelling.  Gastrointestinal: Positive for diarrhea. Negative for abdominal pain.  Endocrine: Positive for polyuria. Negative for polydipsia and polyphagia.  Genitourinary: Positive for frequency. Negative for  dysuria and flank pain.  Musculoskeletal: Negative for back pain and gait problem.  Skin: Positive for rash. Negative for wound.  Neurological: Negative for dizziness and headaches.  Hematological: Negative for adenopathy. Does not bruise/bleed easily.       Objective:   Physical Exam Nursing note reviewed.  Constitutional:       Appearance: Normal appearance.  HENT:     Right Ear: Ear canal and external ear normal. There is impacted cerumen.     Left Ear: Ear canal and external ear normal. There is impacted cerumen.     Nose: Nose normal. No congestion.     Mouth/Throat:     Mouth: Mucous membranes are moist.     Pharynx: No oropharyngeal exudate.  Eyes:     Extraocular Movements: Extraocular movements intact.     Conjunctiva/sclera: Conjunctivae normal.  Neck:     Musculoskeletal: Normal range of motion and neck supple. No muscular tenderness.     Vascular: No carotid bruit.  Cardiovascular:     Rate and Rhythm: Normal rate and regular rhythm.     Pulses:          Dorsalis pedis pulses are 2+ on the right side and 2+ on the left side.       Posterior tibial pulses are 2+ on the right side and 2+ on the left side.  Pulmonary:     Effort: Pulmonary effort is normal.     Breath sounds: Normal breath sounds.  Abdominal:     General: Bowel sounds are normal.     Palpations: Abdomen is soft.     Tenderness: There is no abdominal tenderness. There is left CVA tenderness. There is no right CVA tenderness, guarding or rebound.  Musculoskeletal: Normal range of motion.        General: No swelling.     Right lower leg: No edema.     Left lower leg: No edema.     Right foot: Normal range of motion. No deformity, bunion, Charcot foot, foot drop or prominent metatarsal heads.     Left foot: Normal range of motion. No deformity, bunion, Charcot foot, foot drop or prominent metatarsal heads.  Feet:     Right foot:     Skin integrity: No ulcer, blister, skin breakdown, erythema, warmth, callus, dry skin or fissure.     Toenail Condition: Right toenails are normal.     Left foot:     Skin integrity: No ulcer, blister, skin breakdown, erythema, warmth, callus, dry skin or fissure.     Toenail Condition: Left toenails are normal.  Lymphadenopathy:     Cervical: No cervical adenopathy.  Neurological:     General: No  focal deficit present.     Mental Status: He is alert and oriented to person, place, and time.  Psychiatric:        Mood and Affect: Mood normal.        Behavior: Behavior normal.        Thought Content: Thought content normal.        Judgment: Judgment normal.    BP (!) 143/106 (BP Location: Left Arm, Patient Position: Sitting, Cuff Size: Normal)   Pulse 89   Temp 98.2 F (36.8 C) (Oral)   Ht '5\' 4"'  (1.626 m)   Wt 165 lb 6.4 oz (75 kg)   SpO2 99%   BMI 28.39 kg/m       Assessment & Plan:  1. Uncontrolled type 2 diabetes with neuropathy [facility]) Patient reports  that his blood sugars are better controlled and he reports compliance with medications.  Patient will have CMP in follow-up of his diabetes and use of medications.  Patient will also have urinalysis secondary to complaint of urinary frequency and patient did have urinary tract infection during his recent hospitalization.  Patient will continue his current medications, continue to monitor blood sugars.  Diabetic eye exam and diabetic foot care discussed.  Patient is nonfasting at today's visit and will need a lipid panel at a future visit as well as urine microalbumin/creatinine ratio. - POCT URINALYSIS DIP (CLINITEK) - Comprehensive metabolic panel - gabapentin (NEURONTIN) 300 MG capsule; Take 1 capsule (300 mg total) by mouth at bedtime.  Dispense: 30 capsule; Refill: 5 - Insulin Detemir (LEVEMIR FLEXPEN) 100 UNIT/ML Pen; Inject 20 Units into the skin daily.  Dispense: 15 mL; Refill: 3 - nystatin cream (MYCOSTATIN); Apply 1 application topically 2 (two) times daily.  Dispense: 30 g; Refill: 4  2. Essential hypertension Patient's blood pressure was elevated at today's visit and patient's lisinopril will be increased from 10 mg to 20 mg to better control patient's hypertension.  Patient will return for nurse visit to have recheck of blood pressure in a few weeks.  Patient is currently on lisinopril as well as potassium supplement of  20 mEq.  Patient will have CMP in order to check electrolytes as lisinopril can also raise potassium levels - lisinopril (PRINIVIL,ZESTRIL) 20 MG tablet; Take 1 tablet (20 mg total) by mouth daily. To lower blood pressure  Dispense: 30 tablet; Refill: 5 - KLOR-CON M20 20 MEQ tablet; Take 1 tablet (20 mEq total) by mouth daily.  Dispense: 30 tablet; Refill: 3  3. Diarrhea, unspecified type Patient reports issues with chronic diarrhea for which he has been followed by gastroenterology.  Patient is on potassium 20 mEq and chronic diarrhea may be the source of patient's issues with low potassium  4. Leukocytes in urine Urinalysis done as patient with complaint of continued urinary frequency.  Patient did have a urinary tract infection during his recent hospitalization.  Patient however also still complains of an itchy rash in the groin area/penis which sounds consistent with yeast infection.  Prescription provided for nystatin cream.  Urine will be sent for culture as leukocytes could be secondary to yeast versus bacterial growth/UTI.  Patient will be notified if further treatment is needed based on the culture results. - Urine Culture  An After Visit Summary was printed and given to the patient. Allergies as of 06/11/2018   No Known Allergies     Medication List       Accurate as of June 11, 2018 11:59 PM. Always use your most recent med list.        gabapentin 300 MG capsule Commonly known as:  NEURONTIN Take 1 capsule (300 mg total) by mouth at bedtime.   glucose blood test strip Commonly known as:  TRUE METRIX BLOOD GLUCOSE TEST Use as instructed   Insulin Detemir 100 UNIT/ML Pen Commonly known as:  LEVEMIR FLEXPEN Inject 20 Units into the skin daily.   KLOR-CON M20 20 MEQ tablet Generic drug:  potassium chloride SA Take 1 tablet (20 mEq total) by mouth daily.   Lancets 30G Misc 1 each by Does not apply route 3 (three) times daily.   lisinopril 20 MG tablet Commonly known  as:  PRINIVIL,ZESTRIL Take 1 tablet (20 mg total) by mouth daily. To lower blood pressure   nystatin cream Commonly known as:  MYCOSTATIN Apply  1 application topically 2 (two) times daily.   Potassium Chloride ER 20 MEQ Tbcr Take 20 mEq by mouth daily.   TRUE METRIX METER w/Device Kit 1 each by Does not apply route 3 (three) times daily.      Return in about 4 months (around 10/10/2018) for HTN- 2 week nurse visit; DM/HTN 4 months.

## 2018-06-13 LAB — URINE CULTURE

## 2018-06-21 ENCOUNTER — Telehealth (INDEPENDENT_AMBULATORY_CARE_PROVIDER_SITE_OTHER): Payer: Self-pay

## 2018-06-21 NOTE — Telephone Encounter (Signed)
Call placed using pacific interpreter [phone]) patient wife aware of results per Dr. Jillyn Hidden. She will inform patient. Maryjean Morn, CMA

## 2018-06-21 NOTE — Telephone Encounter (Signed)
-----   Message from Cain Saupe, MD sent at 06/16/2018  9:43 PM EST ----- Notify patient that his urine culture showed mixed urogenital flora. Normal non-fasting complete metabolic panel with glucose of 108. Please schedule or keep follow-up appointment if continued symptoms

## 2018-06-25 ENCOUNTER — Ambulatory Visit (INDEPENDENT_AMBULATORY_CARE_PROVIDER_SITE_OTHER): Payer: Self-pay

## 2018-06-25 ENCOUNTER — Encounter (INDEPENDENT_AMBULATORY_CARE_PROVIDER_SITE_OTHER): Payer: Self-pay

## 2018-06-25 VITALS — BP 117/82 | HR 125 | Temp 98.5°F

## 2018-06-25 DIAGNOSIS — Z0130 Encounter for examination of blood pressure without abnormal findings: Secondary | ICD-10-CM

## 2018-09-12 MED FILL — GABAPENTIN 300 MG CAPSULE: 300 | 30 days supply | Qty: 30 | Fill #1

## 2018-09-12 MED FILL — POTASSIUM CL ER 20 MEQ TAB: 20 | 30 days supply | Qty: 30 | Fill #1

## 2018-09-12 MED FILL — LISINOPRIL 20 MG TAB: 20 | 30 days supply | Qty: 30 | Fill #1

## 2018-10-08 ENCOUNTER — Ambulatory Visit: Payer: Self-pay | Admitting: Primary Care

## 2020-12-06 IMAGING — CT CT ABD-PELV W/O CM
2 of 5 series · 15 of 46 positions shown, 17 images · non-contrast
Comparison: 12/15/2016 right upper quadrant abdominal sonogram.

CLINICAL DATA: Acute abdominal pain, nausea, vomiting and diarrhea
for 3 days.

EXAM:
CT ABDOMEN AND PELVIS WITHOUT CONTRAST
TECHNIQUE: Multidetector CT imaging of the abdomen and pelvis was performed
following the standard protocol without IV contrast.

[Series 3: a/p w/o 5mm · axial · non-contrast · 0.80mm/px · z∈[+718,+1138]mm · 12 of 96 slices shown, 14 images]
[im 8/96  soft-tissue]
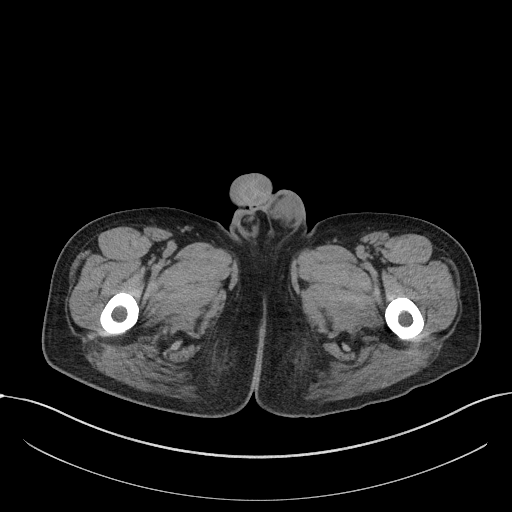
[im 8/96  bone]
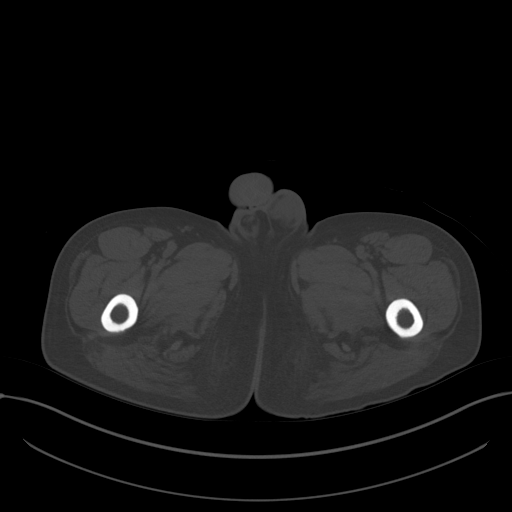
[im 16/96  soft-tissue]
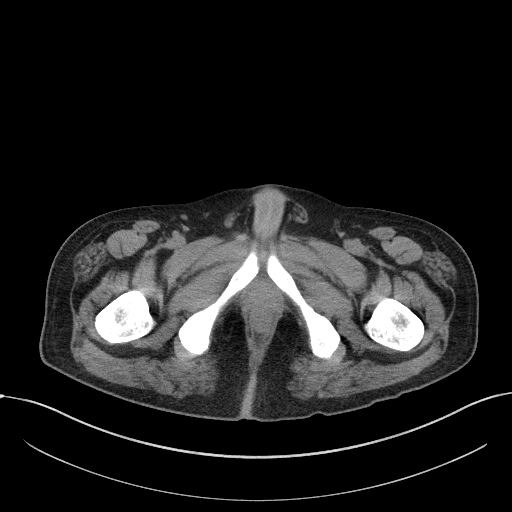
[im 23/96  soft-tissue]
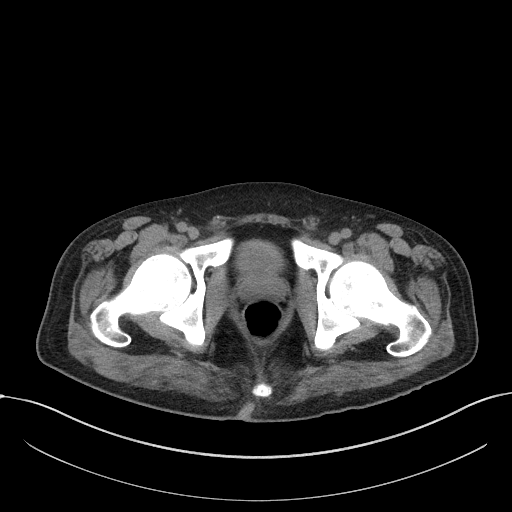
[im 31/96  soft-tissue]
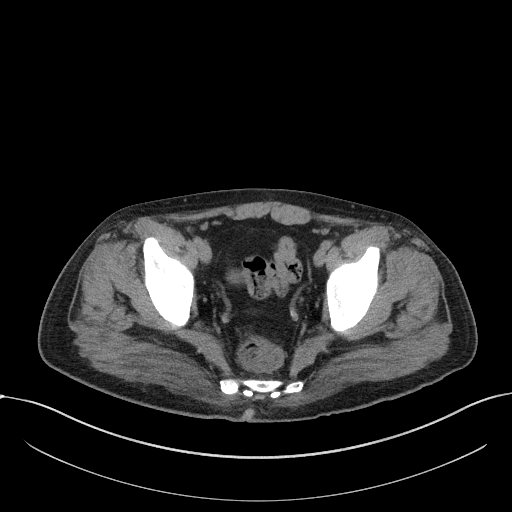
[im 39/96  soft-tissue]
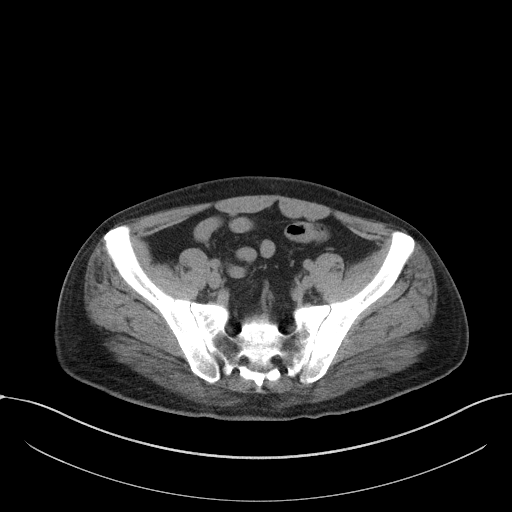
[im 46/96  soft-tissue]
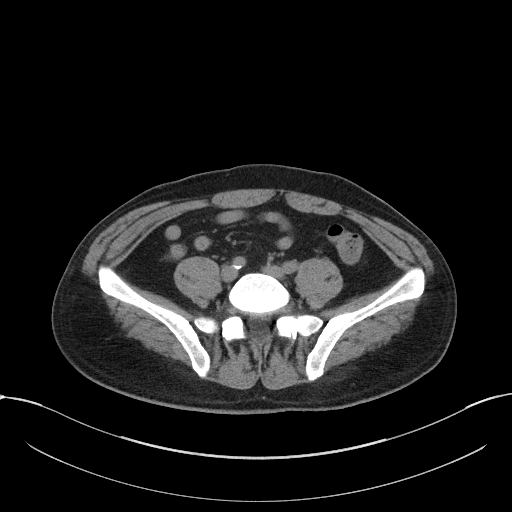
[im 54/96  soft-tissue]
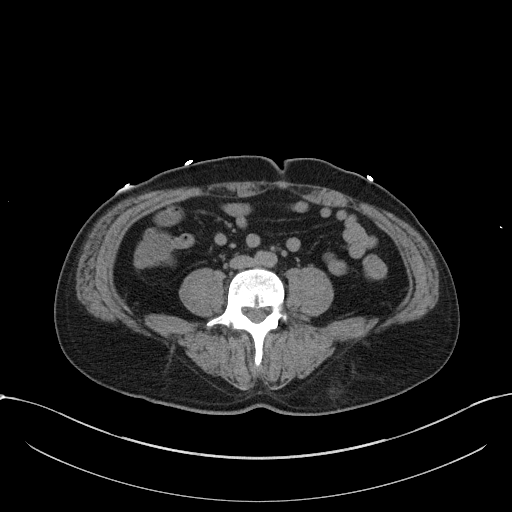
[im 61/96  soft-tissue]
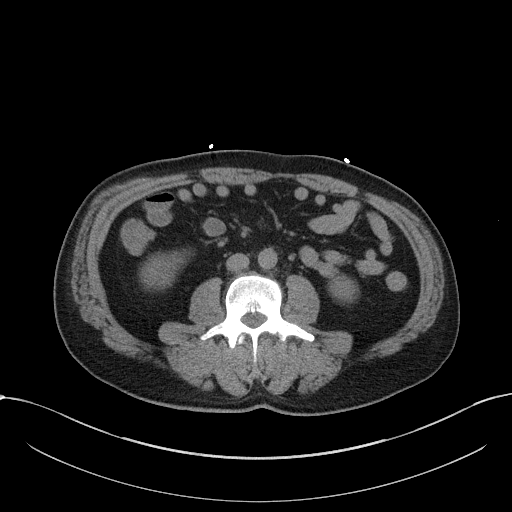
[im 69/96  soft-tissue]
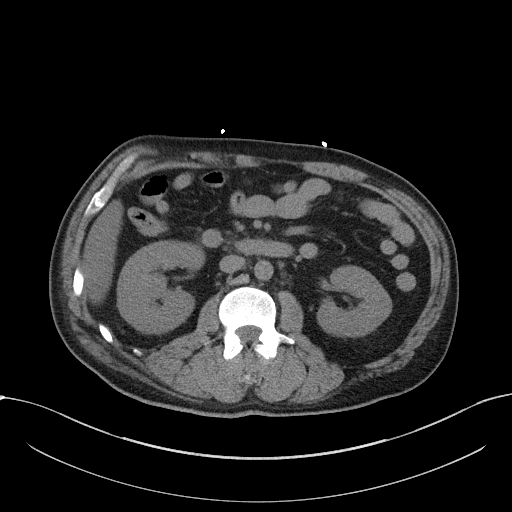
[im 69/96  bone]
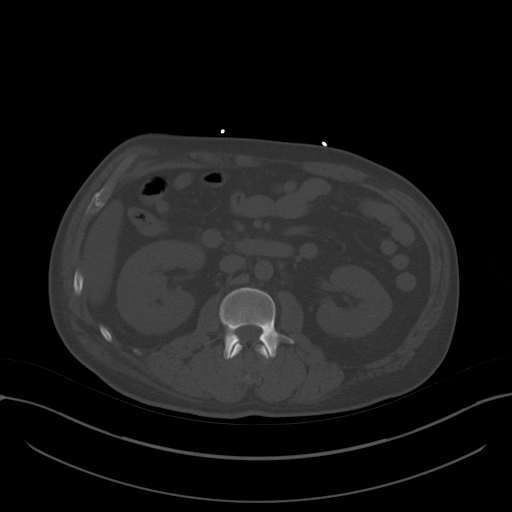
[im 77/96  soft-tissue]
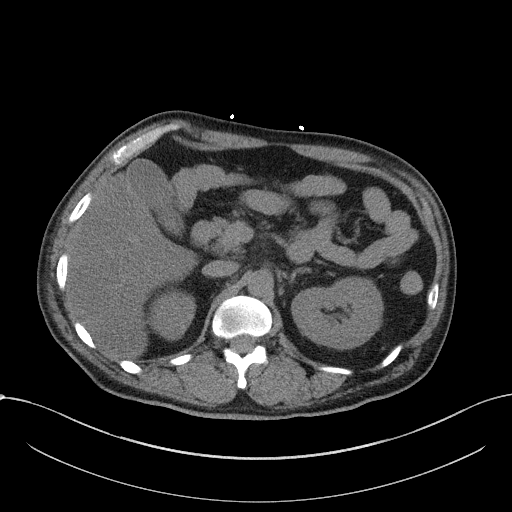
[im 84/96  soft-tissue]
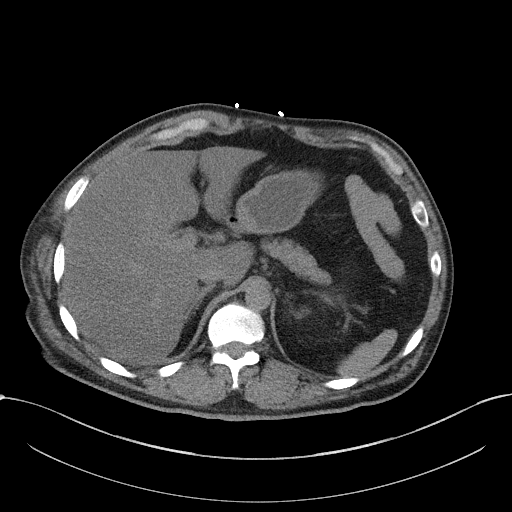
[im 92/96  soft-tissue]
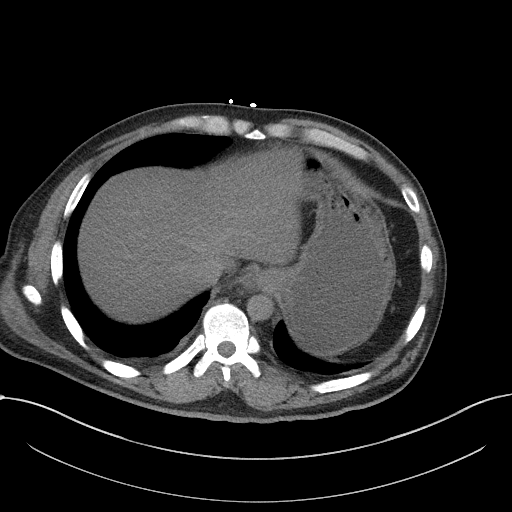

[Series 6: a/p w/o cor · coronal · non-contrast · 0.81mm/px · 3 of 139 slices shown]
[im 47/139  soft-tissue]
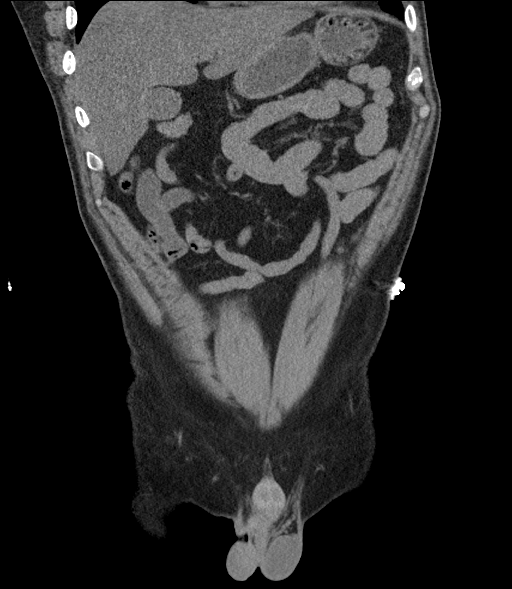
[im 62/139  soft-tissue]
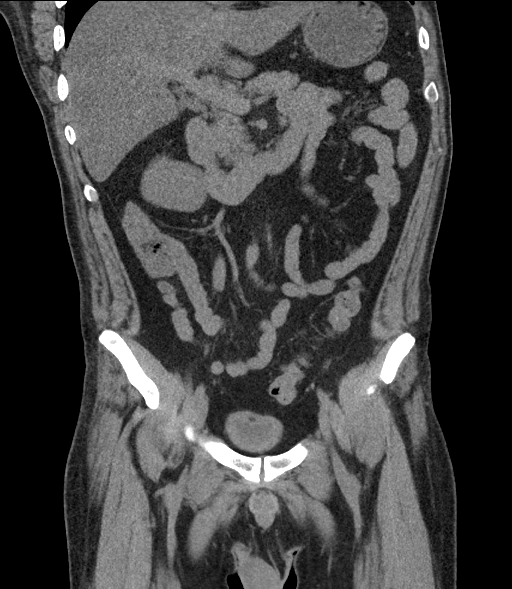
[im 77/139  soft-tissue]
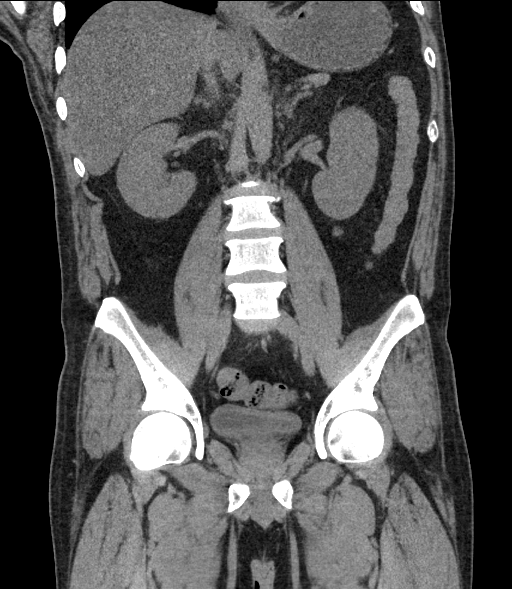

[15 of 46 positions shown; findings below may reference images not displayed]

FINDINGS: Lower chest: No significant pulmonary nodules or acute consolidative
airspace disease. Circumferential wall thickening in the lower
thoracic esophagus.

Hepatobiliary: Diffuse hepatic steatosis. No definite liver surface
irregularity. No liver mass. Normal liver size. Normal gallbladder
with no radiopaque cholelithiasis. No biliary ductal dilatation.

Pancreas: Mild haziness of the peripancreatic fat surrounding the
pancreatic head and neck, suggesting mild acute pancreatitis. No
pancreatic mass or duct dilation. No peripancreatic fluid
collections.

Spleen: Normal size. No mass.

Adrenals/Urinary Tract: Normal adrenals. No hydronephrosis. No renal
stones. No contour deforming renal masses. Normal nondistended
bladder.

Stomach/Bowel: Normal non-distended stomach. Normal caliber small
bowel with no small bowel wall thickening. Appendix is dilated (12
mm diameter) and fluid-filled with no definite appendiceal wall
thickening or appreciable periappendiceal fat stranding. Normal
large bowel with no diverticulosis, large bowel wall thickening or
pericolonic fat stranding.

Vascular/Lymphatic: Mildly atherosclerotic nonaneurysmal abdominal
aorta. No pathologically enlarged lymph nodes in the abdomen or
pelvis.

Reproductive: Normal size prostate.

Other: No pneumoperitoneum, ascites or focal fluid collection.

Musculoskeletal: No aggressive appearing focal osseous lesions. Mild
lumbar spondylosis.
IMPRESSION: 1. Mild haziness of the peripancreatic fat surrounding the
pancreatic head and neck, which may indicate mild acute
pancreatitis. No peripancreatic fluid collections.
2. Appendix is dilated (12 mm diameter) and fluid-filled with no
definite appendiceal wall thickening or appreciable periappendiceal
inflammatory changes. Findings could indicate an appendiceal
mucocele, with acute appendicitis not excluded. Surgical
consultation may be considered. Short-term follow-up CT
abdomen/pelvis with oral and IV contrast may be considered.
3. Circumferential wall thickening in the lower thoracic esophagus,
nonspecific, most commonly due to reflux esophagitis, with Barrett's
esophagus or neoplasm not excluded by imaging. Further evaluation
should be based on clinical assessment.
4. Diffuse hepatic steatosis.
5.  Aortic Atherosclerosis ([id number]-WV9.9).
